# Patient Record
Sex: Male | Born: 1980 | Race: White | Hispanic: No | Marital: Married | Smoking: Never smoker
Health system: Southern US, Community
[De-identification: ages and names within clinical notes are randomized; demographics above are authoritative.]

## PROBLEM LIST (undated history)

## (undated) DIAGNOSIS — K219 Gastro-esophageal reflux disease without esophagitis: Secondary | ICD-10-CM

## (undated) DIAGNOSIS — I10 Essential (primary) hypertension: Secondary | ICD-10-CM

## (undated) DIAGNOSIS — G4733 Obstructive sleep apnea (adult) (pediatric): Secondary | ICD-10-CM

## (undated) DIAGNOSIS — E669 Obesity, unspecified: Secondary | ICD-10-CM

## (undated) DIAGNOSIS — E785 Hyperlipidemia, unspecified: Secondary | ICD-10-CM

## (undated) HISTORY — DX: Obesity, unspecified: E66.9

## (undated) HISTORY — DX: Gastro-esophageal reflux disease without esophagitis: K21.9

## (undated) HISTORY — DX: Essential (primary) hypertension: I10

## (undated) HISTORY — DX: Hyperlipidemia, unspecified: E78.5

## (undated) HISTORY — DX: Obstructive sleep apnea (adult) (pediatric): G47.33

## (undated) HISTORY — PX: HERNIA REPAIR: SHX51

---

## 2005-05-11 ENCOUNTER — Ambulatory Visit (HOSPITAL_COMMUNITY): Admission: RE | Admit: 2005-05-11 | Discharge: 2005-05-11 | Payer: Self-pay | Admitting: General Surgery

## 2005-05-11 ENCOUNTER — Ambulatory Visit (HOSPITAL_BASED_OUTPATIENT_CLINIC_OR_DEPARTMENT_OTHER): Admission: RE | Admit: 2005-05-11 | Discharge: 2005-05-11 | Payer: Self-pay | Admitting: General Surgery

## 2010-01-06 ENCOUNTER — Ambulatory Visit: Payer: Self-pay | Admitting: Internal Medicine

## 2010-01-06 ENCOUNTER — Inpatient Hospital Stay (HOSPITAL_COMMUNITY): Admission: EM | Admit: 2010-01-06 | Discharge: 2010-01-08 | Payer: Self-pay | Admitting: Emergency Medicine

## 2011-03-04 IMAGING — CT CT ABD-PELV W/ CM
2 of 4 series · 14 of 32 positions shown, 19 images · IV contrast (water & 100 ML OMNI 300)
Comparison: None available.

CLINICAL DATA: Nausea and vomiting.  Diarrhea.

CT ABDOMEN AND PELVIS WITH CONTRAST
TECHNIQUE: Multidetector CT imaging of the abdomen and pelvis was
performed following the standard protocol during bolus
administration of intravenous contrast.
Contrast: 100 ml Omnipaque 300

[Series 2: routine abdomen · axial · 0.70mm/px · z∈[-391,-71]mm · 6 of 85 slices shown, 11 images]
[im 13/85  soft-tissue]
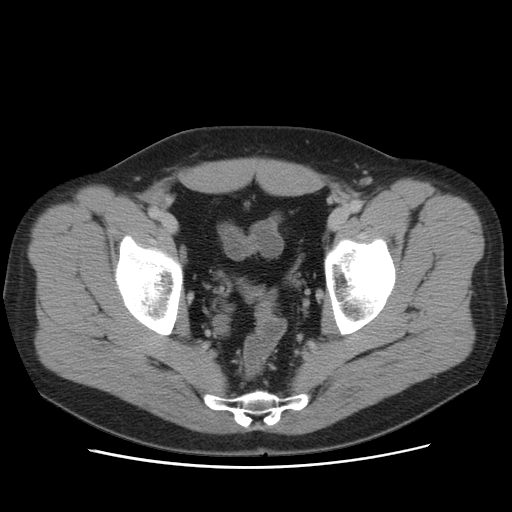
[im 13/85  bone]
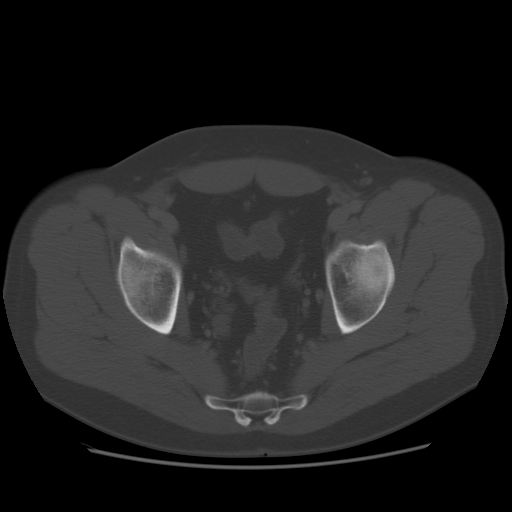
[im 25/85  soft-tissue]
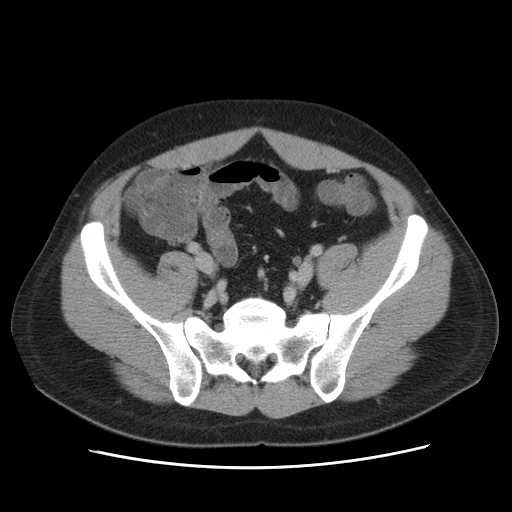
[im 37/85  soft-tissue]
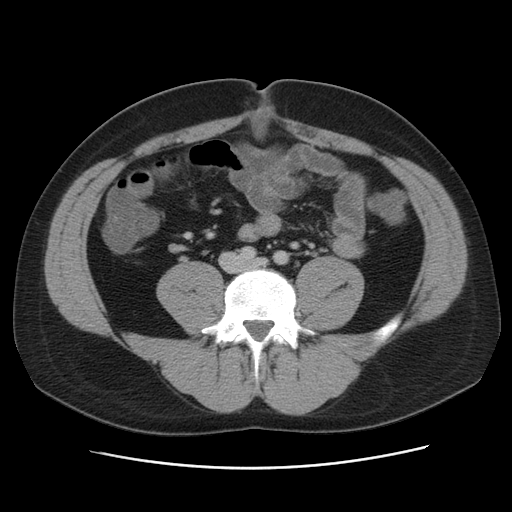
[im 37/85  lung]
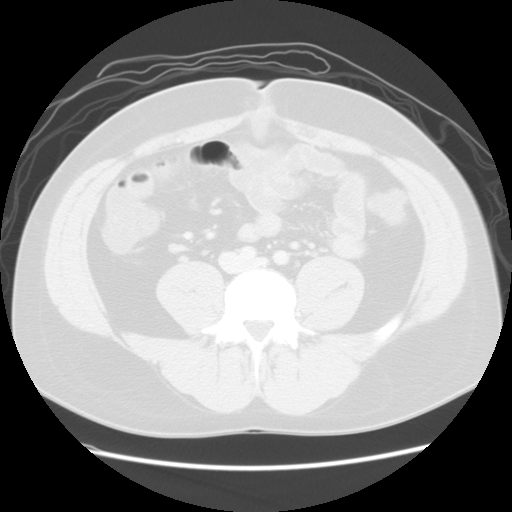
[im 49/85  soft-tissue]
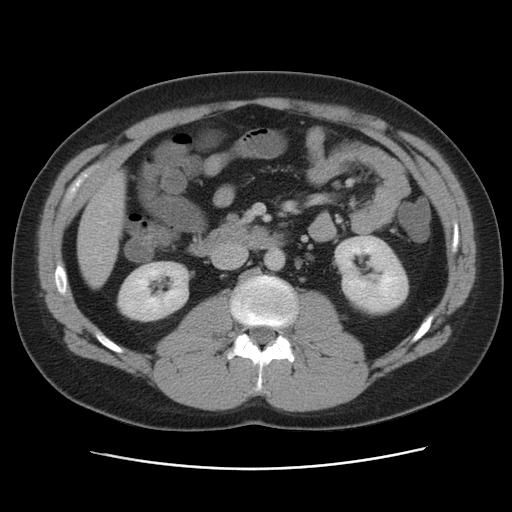
[im 49/85  lung]
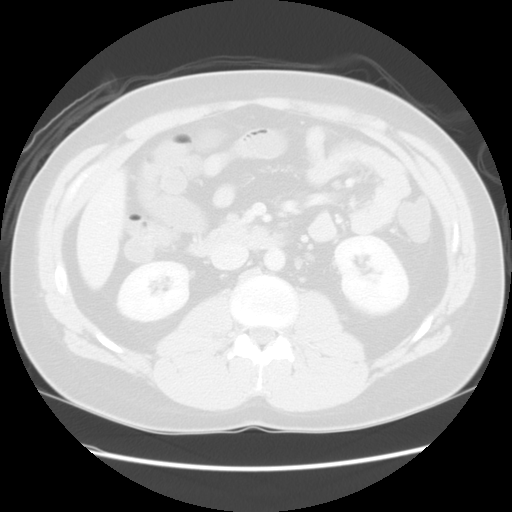
[im 61/85  soft-tissue]
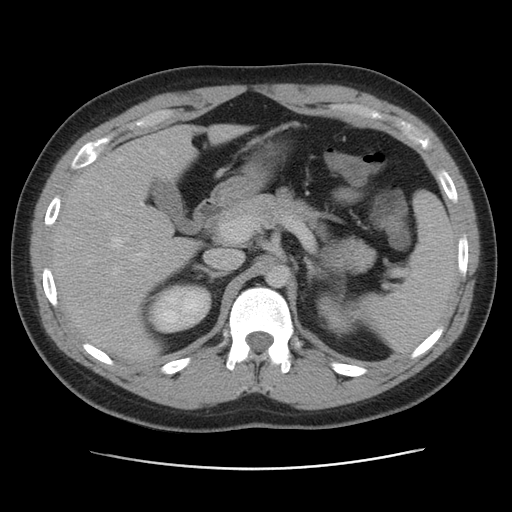
[im 61/85  lung]
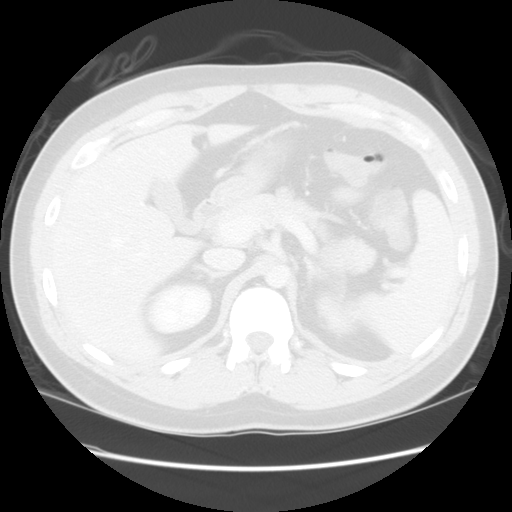
[im 73/85  soft-tissue]
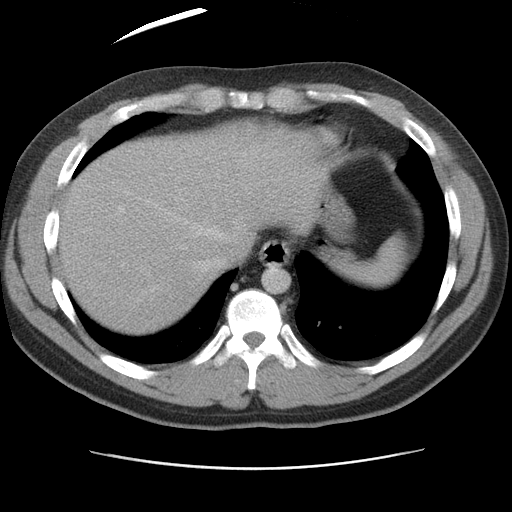
[im 73/85  lung]
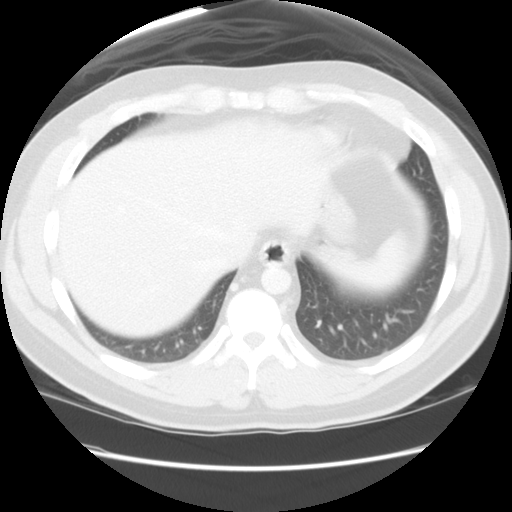

[Series 401: reformatted · sagittal · 0.82mm/px · 8 of 141 slices shown]
[im 13/141  soft-tissue]
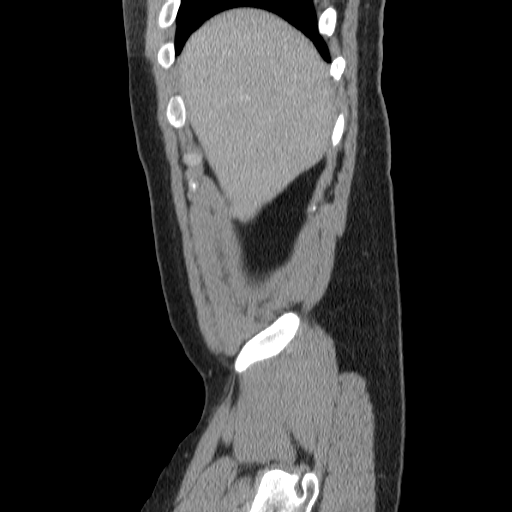
[im 26/141  soft-tissue]
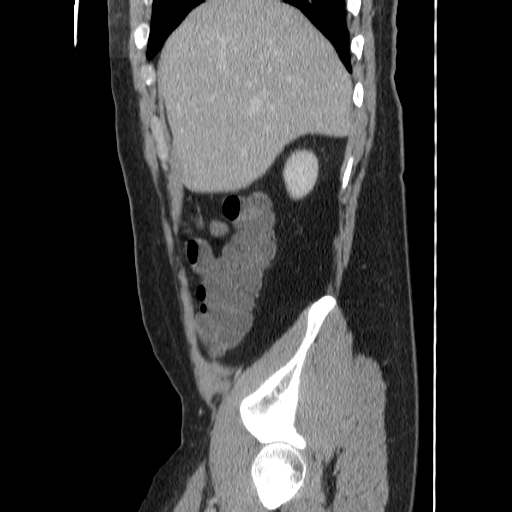
[im 51/141  soft-tissue]
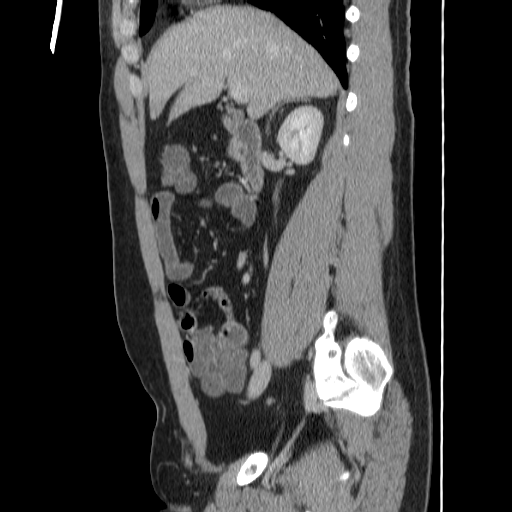
[im 64/141  soft-tissue]
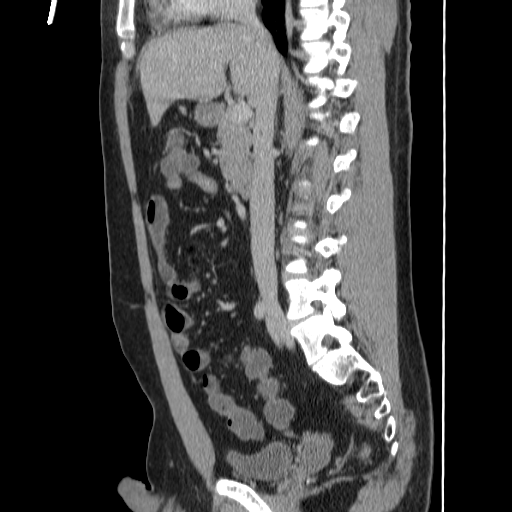
[im 77/141  soft-tissue]
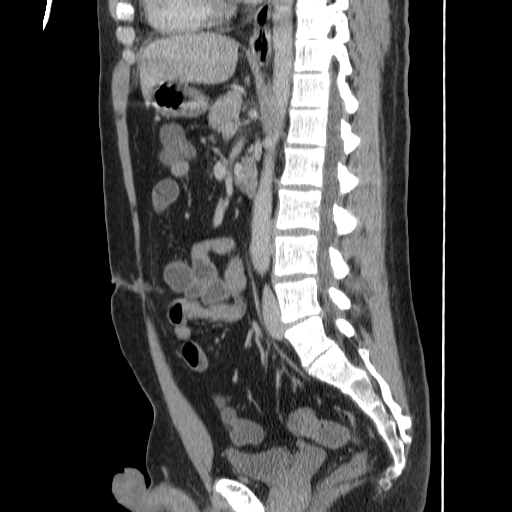
[im 90/141  soft-tissue]
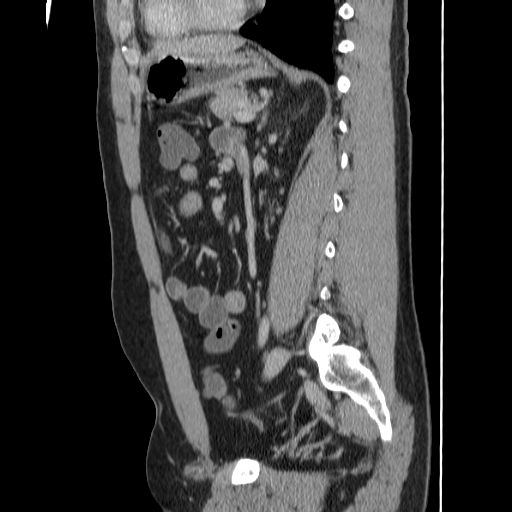
[im 115/141  soft-tissue]
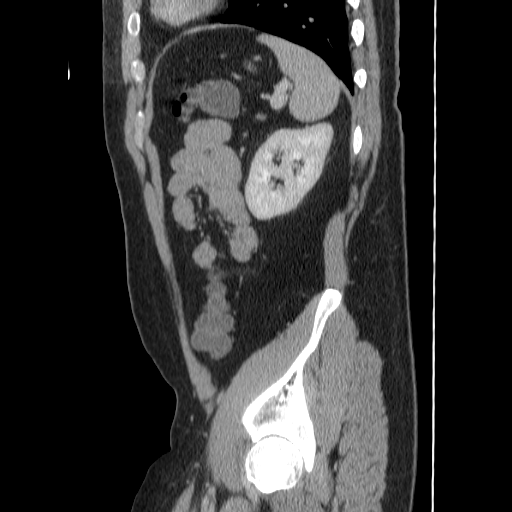
[im 128/141  soft-tissue]
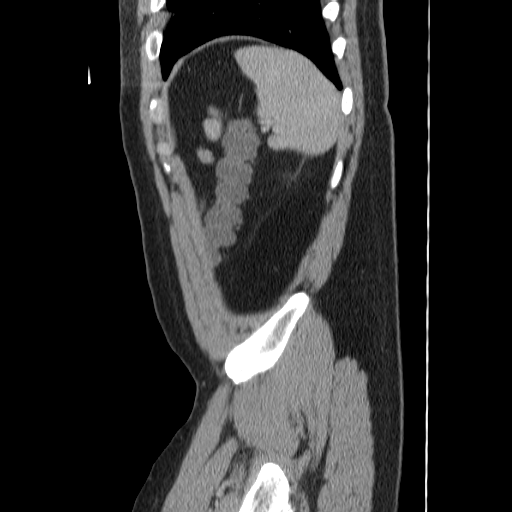

[14 of 32 positions shown; findings below may reference images not displayed]

FINDINGS: Minimal atelectasis is present at the lung bases
bilaterally.  The heart size is normal.  No significant pleural or
pericardial effusion is present.

The liver and spleen are normal.  The stomach, duodenum, and
pancreas are within normal limits.  The common bile duct and
gallbladder are normal.  The adrenal glands and kidneys are normal
bilaterally.

Diverticular changes are present within the sigmoid colon.  Fluid
can be seen throughout the colon.  The wall may be slightly
hyperemic.  No mass lesions are seen.  There is relative dilation
of the mid small bowel.  The transition point is seen distally
along the anterior wall of the abdomen.  This may represent an
adhesion.  There is some wall thickening associated with the
transition point.  No discrete mass lesion is evident.

The urinary bladder and prostate gland are normal.  No significant
free fluid or adenopathy is evident.

The bone windows are unremarkable.  Note is made of partial
sacralization of the L5 segment.
IMPRESSION: 1.  Mild dilation of mid small bowel with a partial obstruction.
2.  Minimal fluid adjacent to a para umbilical hernia repair.  No
residual hernia is identified.
3.  Distal sigmoid diverticulosis without diverticulitis.
4.  Partial sacralization of the L5 segment.

## 2011-03-14 LAB — CBC
HCT: 37.6 % — ABNORMAL LOW (ref 39.0–52.0)
Hemoglobin: 14.4 g/dL (ref 13.0–17.0)
Hemoglobin: 17.1 g/dL — ABNORMAL HIGH (ref 13.0–17.0)
MCHC: 34.5 g/dL (ref 30.0–36.0)
MCHC: 34.5 g/dL (ref 30.0–36.0)
MCHC: 34.7 g/dL (ref 30.0–36.0)
MCV: 85.1 fL (ref 78.0–100.0)
MCV: 85.2 fL (ref 78.0–100.0)
Platelets: 170 10*3/uL (ref 150–400)
Platelets: 231 10*3/uL (ref 150–400)
Platelets: 263 10*3/uL (ref 150–400)
RBC: 4.89 MIL/uL (ref 4.22–5.81)
RBC: 5.8 MIL/uL (ref 4.22–5.81)
RDW: 12.7 % (ref 11.5–15.5)
RDW: 12.8 % (ref 11.5–15.5)
WBC: 26.1 10*3/uL — ABNORMAL HIGH (ref 4.0–10.5)

## 2011-03-14 LAB — URINALYSIS, ROUTINE W REFLEX MICROSCOPIC
Bilirubin Urine: NEGATIVE
Glucose, UA: NEGATIVE mg/dL
Hgb urine dipstick: NEGATIVE
Nitrite: NEGATIVE
Protein, ur: NEGATIVE mg/dL
Specific Gravity, Urine: 1.028 (ref 1.005–1.030)
Urobilinogen, UA: 0.2 mg/dL (ref 0.0–1.0)

## 2011-03-14 LAB — COMPREHENSIVE METABOLIC PANEL
ALT: 19 U/L (ref 0–53)
Albumin: 5.1 g/dL (ref 3.5–5.2)
Alkaline Phosphatase: 65 U/L (ref 39–117)
Chloride: 101 mEq/L (ref 96–112)
Creatinine, Ser: 1.11 mg/dL (ref 0.4–1.5)
GFR calc non Af Amer: >60 mL/min (ref >60)
Sodium: 137 mEq/L (ref 135–145)
Total Bilirubin: 0.7 mg/dL (ref 0.3–1.2)
Total Protein: 8.5 g/dL — ABNORMAL HIGH (ref 6.0–8.3)

## 2011-03-14 LAB — BASIC METABOLIC PANEL
BUN: 3 mg/dL — ABNORMAL LOW (ref 6–23)
BUN: 7 mg/dL (ref 6–23)
CO2: 26 mEq/L (ref 19–32)
CO2: 29 mEq/L (ref 19–32)
Calcium: 8.7 mg/dL (ref 8.4–10.5)
Chloride: 102 mEq/L (ref 96–112)
Chloride: 103 mEq/L (ref 96–112)
Chloride: 107 mEq/L (ref 96–112)
Creatinine, Ser: 0.95 mg/dL (ref 0.4–1.5)
Creatinine, Ser: 1.04 mg/dL (ref 0.4–1.5)
Creatinine, Ser: 1.04 mg/dL (ref 0.4–1.5)
Creatinine, Ser: 1.06 mg/dL (ref 0.4–1.5)
GFR calc Af Amer: >60 mL/min (ref >60)
GFR calc Af Amer: >60 mL/min (ref >60)
GFR calc non Af Amer: >60 mL/min (ref >60)
GFR calc non Af Amer: >60 mL/min (ref >60)
Glucose, Bld: 106 mg/dL — ABNORMAL HIGH (ref 70–99)
Glucose, Bld: 92 mg/dL (ref 70–99)
Glucose, Bld: 97 mg/dL (ref 70–99)
Glucose, Bld: 98 mg/dL (ref 70–99)
Potassium: 3.4 mEq/L — ABNORMAL LOW (ref 3.5–5.1)
Potassium: 3.5 mEq/L (ref 3.5–5.1)
Potassium: 4.3 mEq/L (ref 3.5–5.1)
Sodium: 136 mEq/L (ref 135–145)
Sodium: 138 mEq/L (ref 135–145)

## 2011-03-14 LAB — STOOL CULTURE

## 2011-03-14 LAB — CULTURE, BLOOD (ROUTINE X 2)
Culture: NO GROWTH
Culture: NO GROWTH

## 2011-03-14 LAB — DIFFERENTIAL
Basophils Absolute: 0 10*3/uL (ref 0.0–0.1)
Basophils Relative: 0 % (ref 0–1)
Monocytes Relative: 3 % (ref 3–12)

## 2011-03-14 LAB — LACTIC ACID, PLASMA: Lactic Acid, Venous: 1.3 mmol/L (ref 0.5–2.2)

## 2011-03-14 LAB — LIPASE, BLOOD: Lipase: 18 U/L (ref 11–59)

## 2011-03-14 LAB — PROTIME-INR
INR: 1.12 (ref 0.00–1.49)
Prothrombin Time: 14.3 seconds (ref 11.6–15.2)

## 2011-03-14 LAB — FECAL LACTOFERRIN, QUANT

## 2011-03-14 LAB — TSH: TSH: 0.488 u[IU]/mL (ref 0.350–4.500)

## 2011-03-14 LAB — GIARDIA/CRYPTOSPORIDIUM SCREEN(EIA)

## 2011-03-14 LAB — CLOSTRIDIUM DIFFICILE EIA

## 2011-05-14 NOTE — Op Note (Signed)
NAMEHARLO, FABELA             ACCOUNT NO.:  [id number]   MEDICAL RECORD NO.:  [id number]          PATIENT TYPE:  AMB   LOCATION:  DSC                          FACILITY:  [facility]   PHYSICIAN:  Leonie Man, M.D.   DATE OF BIRTH:  [birth year]   DATE OF PROCEDURE:  05/11/2005  DATE OF DISCHARGE:                                 OPERATIVE REPORT   PREOPERATIVE DIAGNOSIS:  Incarcerated umbilical hernia.   POSTOPERATIVE DIAGNOSIS:  Incarcerated umbilical hernia.   PROCEDURE:  Repair of incarcerated umbilical hernia with mesh.   SURGEON:  Leonie Man, M.D.   ASSISTANT:  Operating room technician.   ANESTHESIA:  General.   SPECIMENS TO PATHOLOGY:  None.   ESTIMATED BLOOD LOSS:  Is minimal.   COMPLICATIONS:  None.   CONDITION TO THE PACU:  Excellent.   INDICATIONS FOR PROCEDURE:  Mr. Lepera is a 30 year old gentleman  presenting, who is a Consulting civil engineer at a Teacher, early years/pre.  He had been having  increasing pain for the past two months in the region of his umbilicus.  He  had not been having any nausea or vomiting, and he has not had any  intractable pain.  The pain usually goes away when he lays flat.  On  examination he was noted to have a small incarcerated umbilical hernia, and  he comes to the operating room now for a repair, after the risks and  potential benefits of the surgery had been discussed and all questions  answered, and a consent obtained.   DESCRIPTION OF PROCEDURE:  Following the induction of satisfactory general  anesthesia, with the patient positioned supinely, the abdomen was routinely  prepped and draped, to be included in the sterile operative field.  A  supraumbilical incision was carried down through the skin and subcutaneous  tissues, raising the umbilical skin as a flap and carrying it inferiorly.  The umbilical hernia sac is dissected free from the overlying skin, and  dissection carried down to the fascia of the anterior abdominal wall.  The  hernia is released from its attachment to the fascia and the incarcerated  omentum and round ligament are then clamped and amputated and ligated.  The  removed hernia sac and contents are then discarded.  The tissue attached to  the hernia is then dissected free with electrocautery.  The defect was then  bridged with a large mesh plug, medium-sized, which was pushed into the  defect and then sutured into the fascia with multiple sutures of #0 Novofil.  At the end of this, the sponge, instrument and sharp counts were verified.  The hernia repair was noted to be intact.  The subcutaneous tissues were  then closed with interrupted #2-0 Vicryl sutures.  The skin was closed with  running #4-0 Monocryl sutures and reinforced with Steri-Strips.  A sterile  compressive dressing was applied.  The anesthetic was reversed.   The patient was removed from the operating room to the recovery room in  stable condition.  He tolerated the procedure well.      PB/MEDQ  D:  05/11/2005  T:  05/11/2005  Job:  [id number] 

## 2016-04-20 ENCOUNTER — Ambulatory Visit (INDEPENDENT_AMBULATORY_CARE_PROVIDER_SITE_OTHER): Payer: Commercial Managed Care - HMO | Admitting: Cardiovascular Disease

## 2016-04-20 ENCOUNTER — Encounter: Payer: Self-pay | Admitting: Cardiovascular Disease

## 2016-04-20 VITALS — BP 174/96 | HR 80 | Ht 67.0 in | Wt 205.2 lb

## 2016-04-20 DIAGNOSIS — E785 Hyperlipidemia, unspecified: Secondary | ICD-10-CM | POA: Diagnosis not present

## 2016-04-20 DIAGNOSIS — K219 Gastro-esophageal reflux disease without esophagitis: Secondary | ICD-10-CM

## 2016-04-20 DIAGNOSIS — I10 Essential (primary) hypertension: Secondary | ICD-10-CM

## 2016-04-20 DIAGNOSIS — R079 Chest pain, unspecified: Secondary | ICD-10-CM | POA: Diagnosis not present

## 2016-04-20 DIAGNOSIS — E669 Obesity, unspecified: Secondary | ICD-10-CM

## 2016-04-20 MED ORDER — LOSARTAN POTASSIUM 50 MG PO TABS: 50.0000 mg | ORAL_TABLET | Freq: Every day | ORAL | Status: DC

## 2016-04-20 NOTE — Patient Instructions (Addendum)
Medication Instructions:  START LOSARTAN 50 MG DAILY   Labwork: FASTING LP/CMET IN 1 WEEK AT [facility] ON THE FIRST FLOOR  Testing/Procedures: Your physician has requested that you have an exercise tolerance test. For further information please visit [email or web address]. Please also follow instruction sheet, as given.  Follow-Up: Your physician recommends that you schedule a follow-up appointment in: 1 MONTH OV  A REFERRAL HAS BEEN MADE TO DR PYRTLE FOR YOUR REFLUX. IF YOU DO NOT HEAR FROM [facility] PLEASE CALL BACK AS [phone]  If you need a refill on your cardiac medications before your next appointment, please call your pharmacy.

## 2016-04-20 NOTE — Progress Notes (Signed)
Cardiology Office Note   Date:  04/20/2016   ID:  Craig AquasJoseph Powell, DOB 26-Jul-1981, MRN [id number]  PCP:  Arlyss QueenONROY,NATHAN, PA-C  Cardiologist:   Chilton Siiffany [place], MD   Chief Complaint  Patient presents with  . New Evaluation    pt c/o discomfort on left rib cage, elevated BP, having heart flutters off and on for 6 months, random dizziness      History of Present Illness: Craig Powell is a 35 y.o. male who presents for Management of hypertension and hyperlipidemia. He reports borderline hypertension since she was in college. He has never been on any medications. He's been reluctant to start anything given that he is young. His primary care physician recommended for both hypertension and high cholesterol.  He checks his blood pressure regularly and it typically ranges 135-160/80-105.  Reports that his last total cholesterol was in the high 200s.  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  He is typically on his feet all day and has developed plantar fasciitis. This makes it difficult for him to exercise at night. He also endorses fatigue and doesn't feel like exercising by the time he gets home. He reports gaining 10-15 pounds in the last several years. He notes that his blood pressure was better controlled when his weight was 100 pounds. He notices episodes of dizziness that typically occur when he is exerting himself physically or when he is upset. He also notices left-sided chest discomfort that has been occurring intermittently for the last 1-1/2 years. He describes it as pressure that is worse with lifting heavy objects. There is some associated shortness of breath but he denies nausea or diaphoresis. He does have episodes of nausea almost every morning and has emesis 3-4 times per week. He reports coughing up clear phlegm. He has a history of severe gastroesophageal reflux disease  that is much better on his PPI. However, if he stops taking it for even a few days he has daily nausea and vomiting in the mornings.  He denies lower extremity edema, orthopnea or PND.  Craig Powell;' father was recently diagnosed with 3 vessel CAD requiring CABG.  His parents urged him to come in for evaluation and start treatment if necessary.   Past Medical History  Diagnosis Date  . Hypertension   . Hyperlipidemia     Past Surgical History  Procedure Laterality Date  . Hernia repair      umbilical, and right groin    Current Outpatient Prescriptions  Medication Sig Dispense Refill  . omeprazole (PRILOSEC) 40 MG capsule Take 1 capsule by mouth daily.  11  . losartan (COZAAR) 50 MG tablet Take 1 tablet (50 mg total) by mouth daily. 30 tablet 5   No current facility-administered medications for this visit.    Allergies:   Review of patient's allergies indicates no known allergies.    Social History:  The patient  reports that he has never smoked. He does not have any smokeless tobacco history on file. He reports that he does not drink alcohol or use illicit drugs.   Family History:  The patient's family history includes Cancer in his paternal grandmother; Heart attack in his maternal grandfather and paternal grandfather; Hypertension in his father, maternal grandfather, and paternal grandfather.    ROS:  Please see the history of present illness.  Otherwise, review of systems are positive for none.   All  other systems are reviewed and negative.    PHYSICAL EXAM: VS:  BP 174/96 mmHg  Pulse 80  Ht  (1.702 m)  Wt 93.078 kg (205 lb 3.2 oz)  BMI 32.13 kg/m2 , BMI Body mass index is 32.13 kg/(m^2). GENERAL:  Well appearing HEENT:  Pupils equal round and reactive, fundi not visualized, oral mucosa unremarkable NECK:  No jugular venous distention, waveform within normal limits, carotid upstroke brisk and symmetric, no bruits, no thyromegaly LYMPHATICS:  No cervical  adenopathy LUNGS:  Clear to auscultation bilaterally HEART:  RRR.  PMI not displaced or sustained,S1 and S2 within normal limits, no S3, no S4, no clicks, no rubs, no murmurs CHEST: No chest wall tenderness to palpation  ABD:  Flat, positive bowel sounds normal in frequency in pitch, no bruits, no rebound, no guarding, no midline pulsatile mass, no hepatomegaly, no splenomegaly EXT:  2 plus pulses throughout, no edema, no cyanosis no clubbing SKIN:  No rashes no nodules NEURO:  Cranial nerves II through XII grossly intact, motor grossly intact throughout PSYCH:  Cognitively intact, oriented to person place and time  EKG:  EKG is ordered today. The ekg ordered today demonstrates sinus rhythm. 80 bpm. Cannot rule out prior inferior MI.  Recent Labs: No results found for requested labs within last 365 days.   Lipid Panel No results found for: CHOL, TRIG, HDL, CHOLHDL, VLDL, LDLCALC, LDLDIRECT    Wt Readings from Last 3 Encounters:  04/20/16 93.078 kg (205 lb 3.2 oz)      ASSESSMENT AND PLAN:  # Hypertension: BP is poorly-controlled both here at at home.  We will start losartan 50 mg po daily. We will check a basic metabolic panel in 1 week.  # Hyperlipidemia: Cholesterol levels have been extremelely elevated in the past, but we will check a fasting lipid panel before starting a statin.  He has been hesitant in the past, but given his father's recent CABG, he is willing to consider it.  # Chest pain: Symptoms are atypical.  However, he has several risk factors for CAD that are untreated.  Therefore, we will obtain a treadmill stress test to evaluate for ischemia.  # Obesity: We discussed the importance of increasing his physical activity to at least 30-40 minutes most days of the week.  We also discussed limiting fried and fatty foods.  # GERD: Symptoms are uncontrolled on PPI.  Will refer to Dr. Rhea Belton for further evaluation.  Current medicines are reviewed at length with the  patient today.  The patient does not have concerns regarding medicines.  The following changes have been made:  Start losartan 50 mg po daily   Labs/ tests ordered today include:   Orders Placed This Encounter  Procedures  . Comprehensive Metabolic Panel (CMET)  . Lipid panel  . Ambulatory referral to Gastroenterology  . Exercise Tolerance Test  . EKG 12-Lead     Disposition:   FU with Moss Berry C. Duke Salvia, MD, [facility] in     This note was written with the assistance of speech recognition software.  Please excuse any transcriptional errors.  Signed, Tyjah Hai C. Duke Salvia, MD, [facility]  04/20/2016 11:50 PM    [facility]

## 2016-04-22 ENCOUNTER — Encounter: Payer: Self-pay | Admitting: Cardiovascular Disease

## 2016-04-22 DIAGNOSIS — I10 Essential (primary) hypertension: Secondary | ICD-10-CM

## 2016-04-22 DIAGNOSIS — E66811 Obesity, class 1: Secondary | ICD-10-CM

## 2016-04-22 DIAGNOSIS — E669 Obesity, unspecified: Secondary | ICD-10-CM

## 2016-04-22 DIAGNOSIS — E785 Hyperlipidemia, unspecified: Secondary | ICD-10-CM | POA: Insufficient documentation

## 2016-04-22 HISTORY — DX: Obesity, class 1: E66.811

## 2016-04-22 HISTORY — DX: Obesity, unspecified: E66.9

## 2016-04-22 HISTORY — DX: Essential (primary) hypertension: I10

## 2016-04-27 ENCOUNTER — Ambulatory Visit (INDEPENDENT_AMBULATORY_CARE_PROVIDER_SITE_OTHER): Payer: Commercial Managed Care - HMO | Admitting: Gastroenterology

## 2016-04-27 ENCOUNTER — Encounter: Payer: Self-pay | Admitting: Gastroenterology

## 2016-04-27 VITALS — BP 120/70 | HR 80 | Ht 67.0 in | Wt 203.0 lb

## 2016-04-27 DIAGNOSIS — K219 Gastro-esophageal reflux disease without esophagitis: Secondary | ICD-10-CM | POA: Diagnosis not present

## 2016-04-27 NOTE — Progress Notes (Signed)
HPI: This is a    very pleasant 35 year old man   who was referred to me by Lonie Peakonroy, Nathan, PA-C  to evaluate  GERD, left upper quadrant pain .    Chief complaint is GERD, left upper quadrant pain  GERD for years.    Vomits in AM, vomits at night.  1-2 times per week he will vomit in AM  Thi shas been a problem for 5-10 years.  Has spells of it.  Started omeprazole about a year ago, takes at bedtime 40mg .  Without this pyrosis constantly.  Post prandial vomiting.  dypshagia occasionally.  Overall stable weight.  Wakes up, within 15 minutes; coughs a lot.  Then vomits  paticularly on cold days.  Takes tylenol PRN for feet  non-smoker.  He has had intermittent left upper quadrant pain for about a year now. It does not radiate. It is not associated with nausea vomiting. It is not positional. Eating does tend to exacerbate it at times. It is a pulling twisting sensation. Lasts 20-40 minutes   Review of systems: Pertinent positive and negative review of systems were noted in the above HPI section. Complete review of systems was performed and was otherwise normal.   Past Medical History  Diagnosis Date  . Hypertension   . Hyperlipidemia   . Essential hypertension 04/22/2016  . Obesity (BMI 30.0-34.9) 04/22/2016    Past Surgical History  Procedure Laterality Date  . Hernia repair      umbilical, and right groin    Current Outpatient Prescriptions  Medication Sig Dispense Refill  . losartan (COZAAR) 50 MG tablet Take 1 tablet (50 mg total) by mouth daily. 30 tablet 5  . omeprazole (PRILOSEC) 40 MG capsule Take 1 capsule by mouth daily.  11   No current facility-administered medications for this visit.    Allergies as of 04/27/2016  . (No Known Allergies)    Family History  Problem Relation Age of Onset  . Hypertension Father   . Hypertension Maternal Grandfather   . Heart attack Maternal Grandfather   . Cancer Paternal Grandmother   . Hypertension Paternal  Grandfather   . Heart attack Paternal Grandfather     Social History   Social History  . Marital Status: Married    Spouse Name: N/A  . Number of Children: N/A  . Years of Education: N/A   Occupational History  . Not on file.   Social History Main Topics  . Smoking status: Never Smoker   . Smokeless tobacco: Never Used  . Alcohol Use: No  . Drug Use: No  . Sexual Activity: Not on file   Other Topics Concern  . Not on file   Social History Narrative     Physical Exam: BP 120/70 mmHg  Pulse 80  Ht 5\' 7"  (1.702 m)  Wt 203 lb (92.08 kg)  BMI 31.79 kg/m2 Constitutional: generally well-appearing Psychiatric: alert and oriented x3 Eyes: extraocular movements intact Mouth: oral pharynx moist, no lesions Neck: supple no lymphadenopathy Cardiovascular: heart regular rate and rhythm Lungs: clear to auscultation bilaterally Abdomen: soft, nontender, nondistended, no obvious ascites, no peritoneal signs, normal bowel sounds Extremities: no lower extremity edema bilaterally Skin: no lesions on visible extremities   Assessment and plan: 35 y.o. male with  Classic GERD symptoms, intermittent dysphasia, left upper quadrant pains  I recommended he change the way he is taking his antiacid medicines. He will begin taking his omeprazole at 20-30 minutes prior to his first meal of the day and  he will add ranitidine 150 mg at bedtime every night. I think this will be better  acid control and he currently has. Given his dysphagia upper quadrant pains and I recommended we proceed with EGD to rule out stricture, H. pylori, peptic ulcer disease. I see no reason for any further blood tests or imaging studies prior to then.   Rob Bunting, MD [facility] 04/27/2016, 8:28 AM  Cc: Lonie Peak, PA-C

## 2016-04-27 NOTE — Patient Instructions (Signed)
You should change the way you are taking your antiacid medicine (omeprazole) so that you are taking it 20-30 minutes prior to a decent meal as that is the way the pill is designed to work most effectively. Ranitidine 150mg  pills, take one at bedtime every night. You will be set up for an upper endoscopy for GERD, dysphagia.

## 2016-04-28 ENCOUNTER — Telehealth (HOSPITAL_COMMUNITY): Payer: Self-pay

## 2016-04-28 NOTE — Telephone Encounter (Signed)
UTR. I will attempt again tomorrow.

## 2016-04-29 ENCOUNTER — Telehealth (HOSPITAL_COMMUNITY): Payer: Self-pay

## 2016-04-29 LAB — COMPREHENSIVE METABOLIC PANEL
ALT: 16 U/L (ref 9–46)
AST: 13 U/L (ref 10–40)
Albumin: 4.6 g/dL (ref 3.6–5.1)
Alkaline Phosphatase: 51 U/L (ref 40–115)
BILIRUBIN TOTAL: 0.5 mg/dL (ref 0.2–1.2)
BUN: 11 mg/dL (ref 7–25)
CALCIUM: 9.7 mg/dL (ref 8.6–10.3)
CHLORIDE: 102 mmol/L (ref 98–110)
CO2: 26 mmol/L (ref 20–31)
Creat: 0.96 mg/dL (ref 0.60–1.35)
GLUCOSE: 102 mg/dL — AB (ref 65–99)
Potassium: 4.7 mmol/L (ref 3.5–5.3)
SODIUM: 137 mmol/L (ref 135–146)
Total Protein: 7 g/dL (ref 6.1–8.1)

## 2016-04-29 LAB — LIPID PANEL
Cholesterol: 202 mg/dL — ABNORMAL HIGH (ref 125–200)
HDL: 34 mg/dL — AB (ref >=40)
LDL CALC: 141 mg/dL — AB (ref <130)
Total CHOL/HDL Ratio: 5.9 Ratio — ABNORMAL HIGH (ref <=5.0)
Triglycerides: 137 mg/dL (ref <150)
VLDL: 27 mg/dL (ref <30)

## 2016-04-29 NOTE — Telephone Encounter (Signed)
UTR Encounter complete. 

## 2016-04-30 ENCOUNTER — Ambulatory Visit (HOSPITAL_COMMUNITY)
Admission: RE | Admit: 2016-04-30 | Discharge: 2016-04-30 | Disposition: A | Discharge status: Home / Self Care | Payer: Commercial Managed Care - HMO | Source: Ambulatory Visit | Attending: Cardiovascular Disease | Admitting: Cardiovascular Disease

## 2016-04-30 DIAGNOSIS — R079 Chest pain, unspecified: Secondary | ICD-10-CM | POA: Diagnosis not present

## 2016-04-30 DIAGNOSIS — I10 Essential (primary) hypertension: Secondary | ICD-10-CM

## 2016-05-02 LAB — EXERCISE TOLERANCE TEST
CHL CUP RESTING HR STRESS: 63 {beats}/min
CHL RATE OF PERCEIVED EXERTION: 17
CSEPED: 12 min
CSEPHR: 106 %
CSEPPHR: 200 {beats}/min
Estimated workload: 13.7 METS
Exercise duration (sec): 12 s
MPHR: 188 {beats}/min

## 2016-05-13 ENCOUNTER — Telehealth: Payer: Self-pay | Admitting: *Deleted

## 2016-05-13 DIAGNOSIS — E7800 Pure hypercholesterolemia, unspecified: Secondary | ICD-10-CM

## 2016-05-13 NOTE — Telephone Encounter (Signed)
-----   Message from Chilton Siiffany [place], MD sent at 05/03/2016 12:58 PM EDT ----- Normal kidney function and electrolytes.  Cholesterol levels are elevated.  Work on diet and exercise at least 30-40 minutes most days of the week.  Lets repeat lipids in 3 months before deciding if he needs to start medicine. Reschedule follow up for after the lipids.

## 2016-05-13 NOTE — Telephone Encounter (Signed)
-----   Message from Chilton Siiffany [place], MD sent at 05/02/2016  6:19 PM EDT ----- Normal stress test test.  Excellent exercise capacity.

## 2016-05-13 NOTE — Telephone Encounter (Signed)
Advised patient of lab results and stress test Rescheduled ov and mailed orders for labs prior to visit

## 2016-05-14 ENCOUNTER — Encounter: Payer: Self-pay | Admitting: Gastroenterology

## 2016-05-28 ENCOUNTER — Ambulatory Visit: Payer: Commercial Managed Care - HMO | Admitting: Gastroenterology

## 2016-05-28 ENCOUNTER — Encounter: Payer: Self-pay | Admitting: Gastroenterology

## 2016-05-28 ENCOUNTER — Ambulatory Visit: Payer: Commercial Managed Care - HMO | Admitting: Cardiovascular Disease

## 2016-05-28 VITALS — BP 106/60 | HR 59 | Temp 98.6°F | Resp 18 | Ht 67.0 in | Wt 203.0 lb

## 2016-05-28 DIAGNOSIS — K219 Gastro-esophageal reflux disease without esophagitis: Secondary | ICD-10-CM

## 2016-05-28 MED ORDER — SODIUM CHLORIDE 0.9 % IV SOLN: 500.0000 mL | INTRAVENOUS | Status: DC

## 2016-05-28 NOTE — Patient Instructions (Addendum)
YOU HAD AN ENDOSCOPIC PROCEDURE TODAY AT THE [facility]:   Refer to the procedure report that was given to you for any specific questions about what was found during the examination.  If the procedure report does not answer your questions, please call your gastroenterologist to clarify.  If you requested that your care partner not be given the details of your procedure findings, then the procedure report has been included in a sealed envelope for you to review at your convenience later.  YOU SHOULD EXPECT: Some feelings of bloating in the abdomen. Passage of more gas than usual.  Walking can help get rid of the air that was put into your GI tract during the procedure and reduce the bloating.   Please Note:  You might notice some irritation and congestion in your nose or some drainage.  This is from the oxygen used during your procedure.  There is no need for concern and it should clear up in a day or so.  SYMPTOMS TO REPORT IMMEDIATELY:    Following upper endoscopy (EGD)  Vomiting of blood or coffee ground material  New chest pain or pain under the shoulder blades  Painful or persistently difficult swallowing  New shortness of breath  Fever of 100F or higher  Black, tarry-looking stools  For urgent or emergent issues, a gastroenterologist can be reached at any hour by calling [phone].   DIET: Your first meal following the procedure should be a small meal and then it is ok to progress to your normal diet.  Drink plenty of fluids but you should avoid alcoholic beverages for 24 hours.  ACTIVITY:  You should plan to take it easy for the rest of today and you should NOT DRIVE or use heavy machinery until tomorrow (because of the sedation medicines used during the test).    FOLLOW UP: Our staff will call the number listed on your records the next business day following your procedure to check on you and address any questions or concerns that you may have regarding the  information given to you following your procedure. If we do not reach you, we will leave a message.  However, if you are feeling well and you are not experiencing any problems, there is no need to return our call.  We will assume that you have returned to your regular daily activities without incident.  If any biopsies were taken you will be contacted by phone or by letter within the next 1-3 weeks.  Please call us at [phone] if you have not heard about the biopsies in 3 weeks.    SIGNATURES/CONFIDENTIALITY: You and/or your care partner have signed paperwork which will be entered into your electronic medical record.  These signatures attest to the fact that that the information above on your After Visit Summary has been reviewed and is understood.  Full responsibility of the confidentiality of this discharge information lies with you and/or your care-partner.  Thank-you for choosing us for your healthcare needs today.  Be sure to take your stomach medicines 1/2 hour before breakfast every day.

## 2016-05-28 NOTE — Progress Notes (Signed)
Called to room to assist during endoscopic procedure.  Patient ID and intended procedure confirmed with present staff. Received instructions for my participation in the procedure from the performing physician.  

## 2016-05-28 NOTE — Op Note (Signed)
[facility] Patient Name: Craig Powell Procedure Date: 05/28/2016 9:16 AM MRN: [id number] Endoscopist: Rachael Fee , MD Age: 35 Referring MD:  Date of Birth: 01-23-1981 Gender: Male Procedure:                Upper GI endoscopy Indications:              Dysphagia, Heartburn Medicines:                Monitored Anesthesia Care Procedure:                Pre-Anesthesia Assessment:                           - Prior to the procedure, a History and Physical                            was performed, and patient medications and                            allergies were reviewed. The patient's tolerance of                            previous anesthesia was also reviewed. The risks                            and benefits of the procedure and the sedation                            options and risks were discussed with the patient.                            All questions were answered, and informed consent                            was obtained. Prior Anticoagulants: The patient has                            taken no previous anticoagulant or antiplatelet                            agents. ASA Grade Assessment: II - A patient with                            mild systemic disease. After reviewing the risks                            and benefits, the patient was deemed in                            satisfactory condition to undergo the procedure.                           After obtaining informed consent, the endoscope was  passed under direct vision. Throughout the                            procedure, the patient's blood pressure, pulse, and                            oxygen saturations were monitored continuously. The                            Model GIF-[id number]) scope was introduced                            through the mouth, and advanced to the second part                            of duodenum. The upper GI endoscopy was               accomplished without difficulty. The patient                            tolerated the procedure well. Scope In: Scope Out: Findings:                 The esophagus was normal.                           The stomach was normal.                           The examined duodenum was normal. Biopsies for                            histology were taken with a cold forceps for                            evaluation of celiac disease. Complications:            No immediate complications. Estimated blood loss:                            None. Estimated Blood Loss:     Estimated blood loss: none. Impression:               - Normal esophagus.                           - Normal stomach.                           - Normal examined duodenum, biopsied given FH of                            Celiac Sprue                           - Recommendation:           - Patient has a contact number available for  emergencies. The signs and symptoms of potential                            delayed complications were discussed with the                            patient. Return to normal activities tomorrow.                            Written discharge instructions were provided to the                            patient.                           - Resume previous diet.                           - Continue present medications (omeprazole in AM                            shortly before breakfast AND ranitidine 150mg  pill                            at bedtime nightly.                           - Await final pathology.                           - No repeat upper endoscopy. Rachael Feeaniel P Jacobs, MD 05/28/2016 9:43:35 AM This report has been signed electronically.

## 2016-05-28 NOTE — Progress Notes (Signed)
Report to PACU, RN, vss, BBS= Clear.  

## 2016-05-31 ENCOUNTER — Telehealth: Payer: Self-pay

## 2016-05-31 NOTE — Telephone Encounter (Signed)
  Follow up Call-  Call back number 05/28/2016  Post procedure Call Back phone  # [phone]  Permission to leave phone message Yes     Patient questions:  Do you have a fever, pain , or abdominal swelling? No. Pain Score  0 *  Have you tolerated food without any problems? Yes.    Have you been able to return to your normal activities? Yes.    Do you have any questions about your discharge instructions: Diet   No. Medications  No. Follow up visit  No.  Do you have questions or concerns about your Care? No.  Actions: * If pain score is 4 or above: No action needed, pain <4.

## 2016-06-04 ENCOUNTER — Encounter: Payer: Self-pay | Admitting: Gastroenterology

## 2016-06-08 ENCOUNTER — Ambulatory Visit: Payer: Commercial Managed Care - HMO | Admitting: Cardiovascular Disease

## 2016-08-11 ENCOUNTER — Encounter: Payer: Self-pay | Admitting: Cardiovascular Disease

## 2016-08-17 ENCOUNTER — Encounter: Payer: Self-pay | Admitting: Cardiovascular Disease

## 2016-08-17 ENCOUNTER — Ambulatory Visit (INDEPENDENT_AMBULATORY_CARE_PROVIDER_SITE_OTHER): Payer: Commercial Managed Care - HMO | Admitting: Cardiovascular Disease

## 2016-08-17 VITALS — BP 144/89 | HR 69 | Ht 67.0 in | Wt 202.2 lb

## 2016-08-17 DIAGNOSIS — I10 Essential (primary) hypertension: Secondary | ICD-10-CM

## 2016-08-17 DIAGNOSIS — E785 Hyperlipidemia, unspecified: Secondary | ICD-10-CM | POA: Diagnosis not present

## 2016-08-17 MED ORDER — LOSARTAN POTASSIUM 100 MG PO TABS: 100.0000 mg | ORAL_TABLET | Freq: Every day | ORAL | 3 refills | Status: DC

## 2016-08-17 MED ORDER — PRAVASTATIN SODIUM 40 MG PO TABS: 40.0000 mg | ORAL_TABLET | Freq: Every evening | ORAL | 3 refills | Status: DC

## 2016-08-17 NOTE — Progress Notes (Signed)
Cardiology Office Note   Date:  08/17/2016   ID:  Craig AquasJoseph Detweiler, DOB March 01, 1981, MRN [id number]  PCP:  Lonie PeakNathan Conroy, PA-C  Cardiologist:   Chilton Siiffany [place], MD   Chief Complaint  Patient presents with  . Follow-up    1 Month; Pt states no Sx.       History of Present Illness: Craig Powell is a 35 y.o. male who presents for management of hypertension and hyperlipidemia. He reports borderline hypertension since he was in college. He started on losartan 04/20/16.  He reports that his blood pressure was initially well-controlled.  Lately it has been in the 130s-140s.  The only time he had low blood pressure was when he went for an EGD and he had been fasting at the time.  At the last appointment he reported atypical chest was and was referred for ETT on 04/30/16 that was negative for ischemia.  Since his last appointment he notes improvement in his chest pain and dizziness.  He continues to have episodes of nausea and emesis, though less frequent than in the past.  He saw Dr. Christella HartiganJacobs and underwent EGD 05/2016 that was unremarkable.  [name] has been trying to exercise more lately by swimming and running.  He denies chest pain or shortness of breath with these activities.  He is limited by his plantar fasciitis.  He had his cholesterol checked with his PCP recently and his total cholesterol and LDL increased from when last checked 04/2016.   Past Medical History:  Diagnosis Date  . Essential hypertension 04/22/2016  . Hyperlipidemia   . Hypertension   . Obesity (BMI 30.0-34.9) 04/22/2016    Past Surgical History:  Procedure Laterality Date  . HERNIA REPAIR     umbilical, and right groin    Current Outpatient Prescriptions  Medication Sig Dispense Refill  . losartan (COZAAR) 100 MG tablet Take 1 tablet (100 mg total) by mouth daily. 90 tablet 3  . omeprazole (PRILOSEC) 40 MG capsule Take 1 capsule by mouth daily.  11  . pravastatin (PRAVACHOL) 40 MG tablet Take 1 tablet (40  mg total) by mouth every evening. 90 tablet 3   No current facility-administered medications for this visit.     Allergies:   Review of patient's allergies indicates no known allergies.    Social History:  The patient  reports that he has never smoked. He has never used smokeless tobacco. He reports that he does not drink alcohol or use drugs.   Family History:  The patient's family history includes Cancer in his paternal grandmother; Heart attack in his maternal grandfather and paternal grandfather; Hypertension in his father, maternal grandfather, and paternal grandfather.    ROS:  Please see the history of present illness.  Otherwise, review of systems are positive for none.   All other systems are reviewed and negative.    PHYSICAL EXAM: VS:  BP (!) 144/89   Pulse 69   Ht 5\' 7"  (1.702 m)   Wt 202 lb 3.2 oz (91.7 kg)   BMI 31.67 kg/m  , BMI Body mass index is 31.67 kg/m. GENERAL:  Well appearing HEENT:  Pupils equal round and reactive, fundi not visualized, oral mucosa unremarkable NECK:  No jugular venous distention, waveform within normal limits, carotid upstroke brisk and symmetric, no bruits, no thyromegaly LYMPHATICS:  No cervical adenopathy LUNGS:  Clear to auscultation bilaterally HEART:  RRR.  PMI not displaced or sustained,S1 and S2 within normal limits, no S3, no S4, no clicks,  no rubs, no murmurs CHEST: No chest wall tenderness to palpation  ABD:  Flat, positive bowel sounds normal in frequency in pitch, no bruits, no rebound, no guarding, no midline pulsatile mass, no hepatomegaly, no splenomegaly EXT:  2 plus pulses throughout, no edema, no cyanosis no clubbing SKIN:  No rashes no nodules NEURO:  Cranial nerves II through XII grossly intact, motor grossly intact throughout PSYCH:  Cognitively intact, oriented to person place and time  EKG:  EKG is ordered today. The ekg ordered today demonstrates sinus rhythm. 80 bpm. Cannot rule out prior inferior MI.  ETT  04/30/16:   Blood pressure demonstrated a hypertensive response to exercise.  There was no ST segment deviation noted during stress.  Normal exercise tolerance test at execellent work of 13.7 mets and adequate heart rate response.  Lowr risk study.   Recent Labs: 04/28/2016: ALT 16; BUN 11; Creat 0.96; Potassium 4.7; Sodium 137   Lipid Panel    Component Value Date/Time   CHOL 202 (H) 04/28/2016 0903   TRIG 137 04/28/2016 0903   HDL 34 (L) 04/28/2016 0903   CHOLHDL 5.9 (H) 04/28/2016 0903   VLDL 27 04/28/2016 0903   LDLCALC 141 (H) 04/28/2016 0903    07/27/16: Total cholesterol 242, triglycerides 161, HDL 39, LDL 171. Soaking this seemed  Wt Readings from Last 3 Encounters:  08/17/16 202 lb 3.2 oz (91.7 kg)  05/28/16 203 lb (92.1 kg)  04/27/16 203 lb (92.1 kg)      ASSESSMENT AND PLAN:  # Hypertension: BP is much better-controlled today but slightly above goal.  It has been high at other appointments as well.  Therefore we will increase losartan to 100 mg daily.  He will get a CMP checked when he repeats his lipids in 6 weeks.   # Hyperlipidemia: Cholesterol levels are higher now than in April. He is now willing to start a statin.  He was told that he is pre-diabetic and wants to reduce the risk of developing diabetes.  Therefore we will start pravastatin 40 mg daily.  Repeat lipids and CMP in 6 weeks.  # Chest pain: Resolved.  ETT was negative for ischemia.   Current medicines are reviewed at length with the patient today.  The patient does not have concerns regarding medicines.  The following changes have been made:  Increase losartan to 100 mg daily   Labs/ tests ordered today include:   Orders Placed This Encounter  Procedures  . Lipid panel  . Comprehensive metabolic panel     Disposition:   FU with Davey Bergsma C. Duke Salviaandolph, MD, [facility] in 1 year.    This note was written with the assistance of speech recognition software.  Please excuse any transcriptional  errors.  Signed, Lyberti Thrush C. Duke Salviaandolph, MD, [facility]  08/17/2016 4:31 PM    [facility]

## 2016-08-17 NOTE — Patient Instructions (Addendum)
Medication Instructions:  START PRAVASTATIN 40 MG DAILY  INCREASE LOSARTAN TO 100 MG DAILY   Labwork: FASTING LP/CMET IN 6 WEEKS AT [facility] ON FIRST FLOOR  Testing/Procedures: NONE   Follow-Up: Your physician wants you to follow-up in: 1 YEAR OF You will receive a reminder letter in the mail two months in advance. If you don't receive a letter, please call our office to schedule the follow-up appointment.  If you need a refill on your cardiac medications before your next appointment, please call your pharmacy.

## 2016-10-07 LAB — LIPID PANEL
CHOL/HDL RATIO: 4.5 ratio (ref <=5.0)
CHOLESTEROL: 158 mg/dL (ref 125–200)
HDL: 35 mg/dL — ABNORMAL LOW (ref >=40)
LDL CALC: 97 mg/dL (ref <130)
TRIGLYCERIDES: 129 mg/dL (ref <150)
VLDL: 26 mg/dL (ref <30)

## 2016-10-07 LAB — COMPREHENSIVE METABOLIC PANEL
ALT: 15 U/L (ref 9–46)
AST: 14 U/L (ref 10–40)
Albumin: 4.6 g/dL (ref 3.6–5.1)
Alkaline Phosphatase: 51 U/L (ref 40–115)
BUN: 15 mg/dL (ref 7–25)
CALCIUM: 9.6 mg/dL (ref 8.6–10.3)
CO2: 26 mmol/L (ref 20–31)
Chloride: 103 mmol/L (ref 98–110)
Creat: 0.98 mg/dL (ref 0.60–1.35)
GLUCOSE: 97 mg/dL (ref 65–99)
POTASSIUM: 4.7 mmol/L (ref 3.5–5.3)
Sodium: 139 mmol/L (ref 135–146)
Total Bilirubin: 0.5 mg/dL (ref 0.2–1.2)
Total Protein: 7.4 g/dL (ref 6.1–8.1)

## 2016-12-24 ENCOUNTER — Inpatient Hospital Stay
Admission: EM | Admit: 2016-12-24 | Discharge: 2016-12-27 | DRG: 558 | Disposition: A | Discharge status: Home / Self Care | Payer: Commercial Managed Care - HMO | Attending: Specialist | Admitting: Specialist

## 2016-12-24 ENCOUNTER — Encounter: Payer: Self-pay | Admitting: Emergency Medicine

## 2016-12-24 DIAGNOSIS — Z6832 Body mass index (BMI) 32.0-32.9, adult: Secondary | ICD-10-CM | POA: Diagnosis not present

## 2016-12-24 DIAGNOSIS — E669 Obesity, unspecified: Secondary | ICD-10-CM | POA: Diagnosis present

## 2016-12-24 DIAGNOSIS — E785 Hyperlipidemia, unspecified: Secondary | ICD-10-CM | POA: Diagnosis not present

## 2016-12-24 DIAGNOSIS — Z8249 Family history of ischemic heart disease and other diseases of the circulatory system: Secondary | ICD-10-CM

## 2016-12-24 DIAGNOSIS — R531 Weakness: Secondary | ICD-10-CM | POA: Diagnosis present

## 2016-12-24 DIAGNOSIS — N179 Acute kidney failure, unspecified: Secondary | ICD-10-CM | POA: Diagnosis present

## 2016-12-24 DIAGNOSIS — I10 Essential (primary) hypertension: Secondary | ICD-10-CM | POA: Diagnosis present

## 2016-12-24 DIAGNOSIS — M6282 Rhabdomyolysis: Secondary | ICD-10-CM | POA: Diagnosis not present

## 2016-12-24 DIAGNOSIS — N289 Disorder of kidney and ureter, unspecified: Secondary | ICD-10-CM

## 2016-12-24 LAB — CBC WITH DIFFERENTIAL/PLATELET
Basophils Absolute: 0.1 10*3/uL (ref 0–0.1)
Basophils Relative: 1 %
Eosinophils Absolute: 0.3 10*3/uL (ref 0–0.7)
Eosinophils Relative: 3 %
HEMATOCRIT: 45.3 % (ref 40.0–52.0)
HEMOGLOBIN: 15.5 g/dL (ref 13.0–18.0)
LYMPHS PCT: 17 %
Lymphs Abs: 2.1 10*3/uL (ref 1.0–3.6)
MCH: 28.3 pg (ref 26.0–34.0)
MCHC: 34.2 g/dL (ref 32.0–36.0)
MCV: 82.7 fL (ref 80.0–100.0)
MONO ABS: 0.8 10*3/uL (ref 0.2–1.0)
MONOS PCT: 7 %
NEUTROS ABS: 9.2 10*3/uL — AB (ref 1.4–6.5)
Neutrophils Relative %: 74 %
Platelets: 342 10*3/uL (ref 150–440)
RBC: 5.48 MIL/uL (ref 4.40–5.90)
RDW: 13.2 % (ref 11.5–14.5)
WBC: 12.6 10*3/uL — ABNORMAL HIGH (ref 3.8–10.6)

## 2016-12-24 LAB — COMPREHENSIVE METABOLIC PANEL
ALK PHOS: 53 U/L (ref 38–126)
ALT: 157 U/L — ABNORMAL HIGH (ref 17–63)
ANION GAP: 11 (ref 5–15)
AST: 828 U/L — AB (ref 15–41)
Albumin: 4.7 g/dL (ref 3.5–5.0)
BILIRUBIN TOTAL: 0.6 mg/dL (ref 0.3–1.2)
BUN: 29 mg/dL — AB (ref 6–20)
CALCIUM: 9.2 mg/dL (ref 8.9–10.3)
CO2: 23 mmol/L (ref 22–32)
Chloride: 102 mmol/L (ref 101–111)
Creatinine, Ser: 2 mg/dL — ABNORMAL HIGH (ref 0.61–1.24)
GFR calc Af Amer: 49 mL/min — ABNORMAL LOW (ref >60)
GFR, EST NON AFRICAN AMERICAN: 42 mL/min — AB (ref >60)
Glucose, Bld: 118 mg/dL — ABNORMAL HIGH (ref 65–99)
POTASSIUM: 3.8 mmol/L (ref 3.5–5.1)
Sodium: 136 mmol/L (ref 135–145)
TOTAL PROTEIN: 8.3 g/dL — AB (ref 6.5–8.1)

## 2016-12-24 LAB — URINALYSIS, COMPLETE (UACMP) WITH MICROSCOPIC
BACTERIA UA: NONE SEEN
BILIRUBIN URINE: NEGATIVE
Glucose, UA: NEGATIVE mg/dL
Ketones, ur: NEGATIVE mg/dL
LEUKOCYTES UA: NEGATIVE
Nitrite: NEGATIVE
PH: 5 (ref 5.0–8.0)
Protein, ur: 100 mg/dL — AB
SPECIFIC GRAVITY, URINE: 1.014 (ref 1.005–1.030)

## 2016-12-24 LAB — CK

## 2016-12-24 MED ORDER — SODIUM CHLORIDE 0.9 % IV SOLN
INTRAVENOUS | Status: DC
  Administered 2016-12-25 – 2016-12-26 (×7): via INTRAVENOUS

## 2016-12-24 MED ORDER — ACETAMINOPHEN 325 MG PO TABS: 650.0000 mg | ORAL_TABLET | Freq: Four times a day (QID) | ORAL | Status: DC | PRN

## 2016-12-24 MED ORDER — HYDROCODONE-ACETAMINOPHEN 5-325 MG PO TABS: 1.0000 | ORAL_TABLET | ORAL | Status: DC | PRN

## 2016-12-24 MED ORDER — ONDANSETRON HCL 4 MG/2ML IJ SOLN: 4.0000 mg | Freq: Four times a day (QID) | INTRAMUSCULAR | Status: DC | PRN

## 2016-12-24 MED ORDER — SODIUM CHLORIDE 0.9 % IV SOLN
Freq: Once | INTRAVENOUS | Status: AC
  Administered 2016-12-24: 17:00:00 via INTRAVENOUS

## 2016-12-24 MED ORDER — PRAVASTATIN SODIUM 40 MG PO TABS
40.0000 mg | ORAL_TABLET | Freq: Every day | ORAL | Status: DC
  Administered 2016-12-25: 40 mg via ORAL
  Filled 2016-12-24: qty 1

## 2016-12-24 MED ORDER — ENOXAPARIN SODIUM 40 MG/0.4ML ~~LOC~~ SOLN
40.0000 mg | SUBCUTANEOUS | Status: DC
  Administered 2016-12-25 – 2016-12-26 (×2): 40 mg via SUBCUTANEOUS
  Filled 2016-12-24 (×2): qty 0.4

## 2016-12-24 MED ORDER — SODIUM CHLORIDE 0.9 % IV SOLN
Freq: Once | INTRAVENOUS | Status: AC
  Administered 2016-12-24: 20:00:00 via INTRAVENOUS

## 2016-12-24 MED ORDER — LABETALOL HCL 5 MG/ML IV SOLN
10.0000 mg | INTRAVENOUS | Status: DC | PRN
  Filled 2016-12-24: qty 4

## 2016-12-24 MED ORDER — FAMOTIDINE 20 MG PO TABS
20.0000 mg | ORAL_TABLET | Freq: Two times a day (BID) | ORAL | Status: DC
  Administered 2016-12-25 – 2016-12-26 (×3): 20 mg via ORAL
  Filled 2016-12-24 (×4): qty 1

## 2016-12-24 MED ORDER — ONDANSETRON HCL 4 MG PO TABS: 4.0000 mg | ORAL_TABLET | Freq: Four times a day (QID) | ORAL | Status: DC | PRN

## 2016-12-24 MED ORDER — ACETAMINOPHEN 650 MG RE SUPP: 650.0000 mg | Freq: Four times a day (QID) | RECTAL | Status: DC | PRN

## 2016-12-24 NOTE — H&P (Signed)
[facility] [place] [facility]   PATIENT NAME: Craig AquasJoseph Powell    MR#:  [id number]  DATE OF BIRTH:  23-Jan-1981  DATE OF ADMISSION:  12/24/2016  PRIMARY CARE PHYSICIAN: Lonie PeakNathan Conroy, PA-C   REQUESTING/REFERRING PHYSICIAN: Lenard LancePaduchowski, MD  CHIEF COMPLAINT:   Chief Complaint  Patient presents with  . Extremity Weakness    HISTORY OF PRESENT ILLNESS:  Craig Powell  is a 35 y.o. male who presents with Dark colored urine. Patient states that he began an exercise routine after not having exercised for a long time. Then his urine became very dark. He came in for evaluation. Here he was found have significant rhabdomyolysis with CPK in the ED greater than 50,000. He also has a little bit of AKI.  Hospitals were called for admission  PAST MEDICAL HISTORY:   Past Medical History:  Diagnosis Date  . Essential hypertension 04/22/2016  . Hyperlipidemia   . Hypertension   . Obesity (BMI 30.0-34.9) 04/22/2016    PAST SURGICAL HISTORY:   Past Surgical History:  Procedure Laterality Date  . HERNIA REPAIR     umbilical, and right groin    SOCIAL HISTORY:   Social History  Substance Use Topics  . Smoking status: Never Smoker  . Smokeless tobacco: Never Used  . Alcohol use No    FAMILY HISTORY:   Family History  Problem Relation Age of Onset  . Hypertension Father   . Hypertension Maternal Grandfather   . Heart attack Maternal Grandfather   . Cancer Paternal Grandmother   . Hypertension Paternal Grandfather   . Heart attack Paternal Grandfather   . Colon cancer Neg Hx     DRUG ALLERGIES:  No Known Allergies  MEDICATIONS AT HOME:   Prior to Admission medications   Medication Sig Start Date End Date Taking? Authorizing Provider  losartan (COZAAR) 100 MG tablet Take 1 tablet (100 mg total) by mouth daily. 08/17/16  Yes Chilton Siiffany [place], MD  pravastatin (PRAVACHOL) 40 MG tablet Take 40 mg by mouth daily.   Yes Historical Provider, MD   ranitidine (ZANTAC) 150 MG tablet Take 150 mg by mouth 2 (two) times daily.   Yes Historical Provider, MD  pravastatin (PRAVACHOL) 40 MG tablet Take 1 tablet (40 mg total) by mouth every evening. 08/17/16 11/15/16  Chilton Siiffany [place], MD    REVIEW OF SYSTEMS:  Review of Systems  Constitutional: Negative for chills, fever, malaise/fatigue and weight loss.  HENT: Negative for ear pain, hearing loss and tinnitus.   Eyes: Negative for blurred vision, double vision, pain and redness.  Respiratory: Negative for cough, hemoptysis and shortness of breath.   Cardiovascular: Negative for chest pain, palpitations, orthopnea and leg swelling.  Gastrointestinal: Negative for abdominal pain, constipation, diarrhea, nausea and vomiting.  Genitourinary: Negative for dysuria, frequency and hematuria.  Musculoskeletal: Positive for myalgias. Negative for back pain, joint pain and neck pain.  Skin:       No acne, rash, or lesions  Neurological: Negative for dizziness, tremors, focal weakness and weakness.  Endo/Heme/Allergies: Negative for polydipsia. Does not bruise/bleed easily.  Psychiatric/Behavioral: Negative for depression. The patient is not nervous/anxious and does not have insomnia.      VITAL SIGNS:   Vitals:   12/24/16 1654 12/24/16 1957  BP: (!) 163/94 (!) 139/94  Pulse: 89 74  Resp: 18 18  Temp: 98 F (36.7 C)   TempSrc: Oral   SpO2: 97% 96%  Weight: 90.7 kg (200 lb)   Height: 5\' 7"  (1.702  m)    Wt Readings from Last 3 Encounters:  12/24/16 90.7 kg (200 lb)  08/17/16 91.7 kg (202 lb 3.2 oz)  05/28/16 92.1 kg (203 lb)    PHYSICAL EXAMINATION:  Physical Exam  Vitals reviewed. Constitutional: He is oriented to person, place, and time. He appears well-developed and well-nourished. No distress.  HENT:  Head: Normocephalic and atraumatic.  Mouth/Throat: Oropharynx is clear and moist.  Eyes: Conjunctivae and EOM are normal. Pupils are equal, round, and reactive to light. No scleral  icterus.  Neck: Normal range of motion. Neck supple. No JVD present. No thyromegaly present.  Cardiovascular: Normal rate, regular rhythm and intact distal pulses.  Exam reveals no gallop and no friction rub.   No murmur heard. Respiratory: Effort normal and breath sounds normal. No respiratory distress. He has no wheezes. He has no rales.  GI: Soft. Bowel sounds are normal. He exhibits no distension. There is no tenderness.  Musculoskeletal: Normal range of motion. He exhibits tenderness (bilateral thigh muscles). He exhibits no edema.  No arthritis, no gout  Lymphadenopathy:    He has no cervical adenopathy.  Neurological: He is alert and oriented to person, place, and time. No cranial nerve deficit.  No dysarthria, no aphasia  Skin: Skin is warm and dry. No rash noted. No erythema.  Psychiatric: He has a normal mood and affect. His behavior is normal. Judgment and thought content normal.    LABORATORY PANEL:   CBC  Recent Labs Lab 12/24/16 1703  WBC 12.6*  HGB 15.5  HCT 45.3  PLT 342   ------------------------------------------------------------------------------------------------------------------  Chemistries   Recent Labs Lab 12/24/16 1703  NA 136  K 3.8  CL 102  CO2 23  GLUCOSE 118*  BUN 29*  CREATININE 2.00*  CALCIUM 9.2  AST 828*  ALT 157*  ALKPHOS 53  BILITOT 0.6   ------------------------------------------------------------------------------------------------------------------  Cardiac Enzymes No results for input(s): TROPONINI in the last 168 hours. ------------------------------------------------------------------------------------------------------------------  RADIOLOGY:  No results found.  EKG:   Orders placed or performed in visit on 04/20/16  . EKG 12-Lead    IMPRESSION AND PLAN:  Principal Problem:   Rhabdomyolysis - CPK greater than 50,000, we will give him aggressive IV fluids and recheck this value in the morning. Active  Problems:   AKI (acute kidney injury) (HCC) - likely due to the rhabdo, IV fluids as above and monitor this closely, avoid nephrotoxins   Essential hypertension - currently stable and normotensive, hold home dose losartan given his acute kidney injury above, continues when necessary antihypertensives if his blood pressure rises.   Hyperlipidemia - continue home meds  All the records are reviewed and case discussed with ED provider. Management plans discussed with the patient and/or family.  DVT PROPHYLAXIS: SubQ heparin  GI PROPHYLAXIS: None  ADMISSION STATUS: Inpatient  CODE STATUS: Full Code Status History    This patient does not have a recorded code status. Please follow your organizational policy for patients in this situation.      TOTAL TIME TAKING CARE OF THIS PATIENT: 45 minutes.    Irvan Tiedt FIELDING 12/24/2016, 9:43 PM  [facility]  Office  [phone]  CC: Primary care physician; Lonie PeakNathan Conroy, PA-C

## 2016-12-24 NOTE — ED Triage Notes (Signed)
Pt states that he worked out for the first time in a long time Wednesday, then violently vomited, pt states that he has had severe muscle soreness as well as foamy dark urine since

## 2016-12-24 NOTE — ED Provider Notes (Signed)
[facility] Emergency Department Provider Note  Time seen: 8:40 PM  I have reviewed the triage vital signs and the nursing notes.   HISTORY  Chief Complaint Extremity Weakness    HPI Craig Powell is a 35 y.o. male with a past medical history of hypertension, who presents to the emergency department for dark colored urine and muscle pain. According to the patient he has not worked out in a very long time, 3 days ago the patientworked out very heavily doing a lot of lower body exercises. He states shortly after working out he vomited. He proceeded to vomit the next day as well. States his muscles has been extremely sore. Since last night he has been peeing very dark-colored urine. Patient was concerned so he came to the emergency department for evaluation. Denies any abdominal pain. Denies any current nausea or vomiting.  Past Medical History:  Diagnosis Date  . Essential hypertension 04/22/2016  . Hyperlipidemia   . Hypertension   . Obesity (BMI 30.0-34.9) 04/22/2016    Patient Active Problem List   Diagnosis Date Noted  . Essential hypertension 04/22/2016  . Hyperlipidemia 04/22/2016  . Obesity (BMI 30.0-34.9) 04/22/2016    Past Surgical History:  Procedure Laterality Date  . HERNIA REPAIR     umbilical, and right groin    Prior to Admission medications   Medication Sig Start Date End Date Taking? Authorizing Provider  losartan (COZAAR) 100 MG tablet Take 1 tablet (100 mg total) by mouth daily. 08/17/16   Chilton Siiffany [place], MD  omeprazole (PRILOSEC) 40 MG capsule Take 1 capsule by mouth daily. 03/30/16   Historical Provider, MD  pravastatin (PRAVACHOL) 40 MG tablet Take 1 tablet (40 mg total) by mouth every evening. 08/17/16 11/15/16  Chilton Siiffany [place], MD    No Known Allergies  Family History  Problem Relation Age of Onset  . Hypertension Father   . Hypertension Maternal Grandfather   . Heart attack Maternal Grandfather   . Cancer Paternal  Grandmother   . Hypertension Paternal Grandfather   . Heart attack Paternal Grandfather   . Colon cancer Neg Hx     Social History Social History  Substance Use Topics  . Smoking status: Never Smoker  . Smokeless tobacco: Never Used  . Alcohol use No    Review of Systems Constitutional: Negative for fever. Cardiovascular: Negative for chest pain. Respiratory: Negative for shortness of breath. Gastrointestinal: Negative for abdominal pain. Positive for nausea and vomiting which have now resolved. Genitourinary: Negative for dysuria. Positive for very dark urine. Neurological: Negative for headache 10-point ROS otherwise negative.  ____________________________________________   PHYSICAL EXAM:  VITAL SIGNS: ED Triage Vitals [12/24/16 1654]  Enc Vitals Group     BP (!) 163/94     Pulse Rate 89     Resp 18     Temp 98 F (36.7 C)     Temp Source Oral     SpO2 97 %     Weight 200 lb (90.7 kg)     Height 5\' 7"  (1.702 m)     Head Circumference      Peak Flow      Pain Score 6     Pain Loc      Pain Edu?      Excl. in GC?    Constitutional: Alert and oriented. Well appearing and in no distress. Eyes: Normal exam ENT   Head: Normocephalic and atraumatic.   Mouth/Throat: Mucous membranes are moist. Cardiovascular: Normal rate, regular rhythm. No  murmur Respiratory: Normal respiratory effort without tachypnea nor retractions. Breath sounds are clear Gastrointestinal: Soft and nontender. No distention. Musculoskeletal: Source/tender extremities. Neurologic:  Normal speech and language. No gross focal neurologic deficits Skin:  Skin is warm, dry and intact.  Psychiatric: Mood and affect are normal.   ____________________________________________   INITIAL IMPRESSION / ASSESSMENT AND PLAN / ED COURSE  Pertinent labs & imaging results that were available during my care of the patient were reviewed by me and considered in my medical decision making (see chart for  details).  Patient presents the emergency department with sore muscles and dark urine. Highly suspect rhabdomyolysis. Patient is receiving 3 L of fluid in the emergency department. Creatinine has increased from normal to 2.0. Currently awaiting CK result is they are manually diluting it.  CK is resulted greater than 50,000. We will continue with IV hydration. Patient will be admitted to the hospital for further treatment.  ____________________________________________   FINAL CLINICAL IMPRESSION(S) / ED DIAGNOSES  Rhabdomyolysis    Minna AntisKevin Maryjean Corpening, MD 12/24/16 2114

## 2016-12-25 LAB — BASIC METABOLIC PANEL
Anion gap: 4 — ABNORMAL LOW (ref 5–15)
BUN: 25 mg/dL — ABNORMAL HIGH (ref 6–20)
CO2: 25 mmol/L (ref 22–32)
Calcium: 8.2 mg/dL — ABNORMAL LOW (ref 8.9–10.3)
Chloride: 112 mmol/L — ABNORMAL HIGH (ref 101–111)
Creatinine, Ser: 1.69 mg/dL — ABNORMAL HIGH (ref 0.61–1.24)
GFR calc Af Amer: 60 mL/min — ABNORMAL LOW (ref >60)
GFR calc non Af Amer: 52 mL/min — ABNORMAL LOW (ref >60)
Glucose, Bld: 104 mg/dL — ABNORMAL HIGH (ref 65–99)
Potassium: 4.5 mmol/L (ref 3.5–5.1)
Sodium: 141 mmol/L (ref 135–145)

## 2016-12-25 LAB — CBC
HCT: 38.4 % — ABNORMAL LOW (ref 40.0–52.0)
Hemoglobin: 13.2 g/dL (ref 13.0–18.0)
MCH: 28.6 pg (ref 26.0–34.0)
MCHC: 34.4 g/dL (ref 32.0–36.0)
MCV: 83.1 fL (ref 80.0–100.0)
PLATELETS: 258 10*3/uL (ref 150–440)
RBC: 4.61 MIL/uL (ref 4.40–5.90)
RDW: 13 % (ref 11.5–14.5)
WBC: 10 10*3/uL (ref 3.8–10.6)

## 2016-12-25 LAB — CK: Total CK: >50000 U/L — ABNORMAL HIGH (ref 49–397)

## 2016-12-25 MED ORDER — GUAIFENESIN-DM 100-10 MG/5ML PO SYRP
5.0000 mL | ORAL_SOLUTION | ORAL | Status: DC | PRN
  Administered 2016-12-25: 5 mL via ORAL
  Filled 2016-12-25: qty 5

## 2016-12-25 NOTE — Progress Notes (Signed)
[facility] [place] at [facility]   PATIENT NAME: Craig Powell Moller    MRN#:  [id number]  DATE OF BIRTH:  March 09, 1981  SUBJECTIVE:  Hospital Day: 1 day Craig Powell Judy is a 35 y.o. male presenting with Extremity Weakness .   Overnight events: No acute overnight events Interval Events: No complaints at this time,, urine starting to clear  REVIEW OF SYSTEMS:  CONSTITUTIONAL: No fever, fatigue or weakness.  EYES: No blurred or double vision.  EARS, NOSE, AND THROAT: No tinnitus or ear pain.  RESPIRATORY: No cough, shortness of breath, wheezing or hemoptysis.  CARDIOVASCULAR: No chest pain, orthopnea, edema.  GASTROINTESTINAL: No nausea, vomiting, diarrhea or abdominal pain.  GENITOURINARY: No dysuria, hematuria.  ENDOCRINE: No polyuria, nocturia,  HEMATOLOGY: No anemia, easy bruising or bleeding SKIN: No rash or lesion. MUSCULOSKELETAL: No joint pain or arthritis.   NEUROLOGIC: No tingling, numbness, weakness.  PSYCHIATRY: No anxiety or depression.   DRUG ALLERGIES:  No Known Allergies  VITALS:  Blood pressure (!) 141/75, pulse 76, temperature 98.4 F (36.9 C), temperature source Oral, resp. rate 18, height 5\' 7"  (1.702 m), weight 93.4 kg (205 lb 12.8 oz), SpO2 99 %.  PHYSICAL EXAMINATION:  VITAL SIGNS: Vitals:   12/25/16 0815 12/25/16 1323  BP: (!) 143/83 (!) 141/75  Pulse: 74 76  Resp: 18 18  Temp: 97.9 F (36.6 C) 98.4 F (36.9 C)   GENERAL:35 y.o.male currently in no acute distress.  HEAD: Normocephalic, atraumatic.  EYES: Pupils equal, round, reactive to light. Extraocular muscles intact. No scleral icterus.  MOUTH: Moist mucosal membrane. Dentition intact. No abscess noted.  EAR, NOSE, THROAT: Clear without exudates. No external lesions.  NECK: Supple. No thyromegaly. No nodules. No JVD.  PULMONARY: Clear to ascultation, without wheeze rails or rhonci. No use of accessory muscles, Good respiratory effort. good air entry  bilaterally CHEST: Nontender to palpation.  CARDIOVASCULAR: S1 and S2. Regular rate and rhythm. No murmurs, rubs, or gallops. No edema. Pedal pulses 2+ bilaterally.  GASTROINTESTINAL: Soft, nontender, nondistended. No masses. Positive bowel sounds. No hepatosplenomegaly.  MUSCULOSKELETAL: No swelling, clubbing, or edema. Range of motion full in all extremities.  NEUROLOGIC: Cranial nerves II through XII are intact. No gross focal neurological deficits. Sensation intact. Reflexes intact.  SKIN: No ulceration, lesions, rashes, or cyanosis. Skin warm and dry. Turgor intact.  PSYCHIATRIC: Mood, affect within normal limits. The patient is awake, alert and oriented x 3. Insight, judgment intact.      LABORATORY PANEL:   CBC  Recent Labs Lab 12/25/16 0440  WBC 10.0  HGB 13.2  HCT 38.4*  PLT 258   ------------------------------------------------------------------------------------------------------------------  Chemistries   Recent Labs Lab 12/24/16 1703 12/25/16 0440  NA 136 141  K 3.8 4.5  CL 102 112*  CO2 23 25  GLUCOSE 118* 104*  BUN 29* 25*  CREATININE 2.00* 1.69*  CALCIUM 9.2 8.2*  AST 828*  --   ALT 157*  --   ALKPHOS 53  --   BILITOT 0.6  --    ------------------------------------------------------------------------------------------------------------------  Cardiac Enzymes No results for input(s): TROPONINI in the last 168 hours. ------------------------------------------------------------------------------------------------------------------  RADIOLOGY:  No results found.  EKG:   Orders placed or performed in visit on 04/20/16  . EKG 12-Lead    ASSESSMENT AND PLAN:   Craig Powell Karle is a 35 y.o. male presenting with Extremity Weakness . Admitted 12/24/2016 : Day #: 1 day   1. Rhabdomyolysis: CK still remarkably elevated, continue IV fluids, recheck CK in the  morning 2. Acute kidney injury: Improved 3. Essential hypertension continue home  medications 4. Hyperlipidemia unspecified: Hold statins given #1  All the records are reviewed and case discussed with Care Management/Social Workerr. Management plans discussed with the patient, family and they are in agreement.  CODE STATUS: full TOTAL TIME TAKING CARE OF THIS PATIENT: 28 minutes.   POSSIBLE D/C IN 1DAYS, DEPENDING ON CLINICAL CONDITION.   Almon Whitford,  Mardi MainlandDavid K M.D on 12/25/2016 at 2:59 PM  Between 7am to 6pm - Pager - [phone]  After 6pm: House Pager: - [phone]  [facility]  Office  [phone]  CC: Primary care physician; Lonie PeakNathan Conroy, PA-C

## 2016-12-26 LAB — BASIC METABOLIC PANEL
Anion gap: 5 (ref 5–15)
BUN: 15 mg/dL (ref 6–20)
CHLORIDE: 112 mmol/L — AB (ref 101–111)
CO2: 24 mmol/L (ref 22–32)
Calcium: 8.6 mg/dL — ABNORMAL LOW (ref 8.9–10.3)
Creatinine, Ser: 1.4 mg/dL — ABNORMAL HIGH (ref 0.61–1.24)
GFR calc non Af Amer: >60 mL/min (ref >60)
Glucose, Bld: 100 mg/dL — ABNORMAL HIGH (ref 65–99)
POTASSIUM: 4.6 mmol/L (ref 3.5–5.1)
SODIUM: 141 mmol/L (ref 135–145)

## 2016-12-26 LAB — CK: Total CK: >50000 U/L — ABNORMAL HIGH (ref 49–397)

## 2016-12-26 NOTE — Progress Notes (Signed)
[facility] [place] at [facility]   PATIENT NAME: Craig Powell    MRN#:  [id number]  DATE OF BIRTH:  12/22/1981  SUBJECTIVE:  Hospital Day: 2 days Craig Powell is a 35 y.o. male presenting with Extremity Weakness .   Overnight events: No acute overnight events Interval Events: No complaints at this time,, urine starting to clear, Leg pain improved  REVIEW OF SYSTEMS:  CONSTITUTIONAL: No fever, fatigue or weakness.  EYES: No blurred or double vision.  EARS, NOSE, AND THROAT: No tinnitus or ear pain.  RESPIRATORY: No cough, shortness of breath, wheezing or hemoptysis.  CARDIOVASCULAR: No chest pain, orthopnea, edema.  GASTROINTESTINAL: No nausea, vomiting, diarrhea or abdominal pain.  GENITOURINARY: No dysuria, hematuria.  ENDOCRINE: No polyuria, nocturia,  HEMATOLOGY: No anemia, easy bruising or bleeding SKIN: No rash or lesion. MUSCULOSKELETAL: No joint pain or arthritis.   NEUROLOGIC: No tingling, numbness, weakness.  PSYCHIATRY: No anxiety or depression.   DRUG ALLERGIES:  No Known Allergies  VITALS:  Blood pressure 138/76, pulse 74, temperature 97.5 F (36.4 C), temperature source Oral, resp. rate 18, height 5\' 7"  (1.702 m), weight 93.4 kg (205 lb 12.8 oz), SpO2 96 %.  PHYSICAL EXAMINATION:  VITAL SIGNS: Vitals:   12/26/16 0359 12/26/16 1048  BP: 135/87 138/76  Pulse: 64 74  Resp: 20 18  Temp: 98.2 F (36.8 C) 97.5 F (36.4 C)   GENERAL:35 y.o.male currently in no acute distress.  HEAD: Normocephalic, atraumatic.  EYES: Pupils equal, round, reactive to light. Extraocular muscles intact. No scleral icterus.  MOUTH: Moist mucosal membrane. Dentition intact. No abscess noted.  EAR, NOSE, THROAT: Clear without exudates. No external lesions.  NECK: Supple. No thyromegaly. No nodules. No JVD.  PULMONARY: Clear to ascultation, without wheeze rails or rhonci. No use of accessory muscles, Good respiratory effort. good air entry  bilaterally CHEST: Nontender to palpation.  CARDIOVASCULAR: S1 and S2. Regular rate and rhythm. No murmurs, rubs, or gallops. No edema. Pedal pulses 2+ bilaterally.  GASTROINTESTINAL: Soft, nontender, nondistended. No masses. Positive bowel sounds. No hepatosplenomegaly.  MUSCULOSKELETAL: No swelling, clubbing, or edema. Range of motion full in all extremities.  NEUROLOGIC: Cranial nerves II through XII are intact. No gross focal neurological deficits. Sensation intact. Reflexes intact.  SKIN: No ulceration, lesions, rashes, or cyanosis. Skin warm and dry. Turgor intact.  PSYCHIATRIC: Mood, affect within normal limits. The patient is awake, alert and oriented x 3. Insight, judgment intact.      LABORATORY PANEL:   CBC  Recent Labs Lab 12/25/16 0440  WBC 10.0  HGB 13.2  HCT 38.4*  PLT 258   ------------------------------------------------------------------------------------------------------------------  Chemistries   Recent Labs Lab 12/24/16 1703  12/26/16 0435  NA 136  < > 141  K 3.8  < > 4.6  CL 102  < > 112*  CO2 23  < > 24  GLUCOSE 118*  < > 100*  BUN 29*  < > 15  CREATININE 2.00*  < > 1.40*  CALCIUM 9.2  < > 8.6*  AST 828*  --   --   ALT 157*  --   --   ALKPHOS 53  --   --   BILITOT 0.6  --   --   < > = values in this interval not displayed. ------------------------------------------------------------------------------------------------------------------  Cardiac Enzymes No results for input(s): TROPONINI in the last 168 hours. ------------------------------------------------------------------------------------------------------------------  RADIOLOGY:  No results found.  EKG:   Orders placed or performed in visit on 04/20/16  .  EKG 12-Lead    ASSESSMENT AND PLAN:   Craig Powell is a 35 y.o. male presenting with Extremity Weakness . Admitted 12/24/2016 : Day #: 2 days   1. Rhabdomyolysis: CK still remarkably elevated (too high to calculate),  continue IV fluids, recheck CK in the morning, I suspect there is a downward trend and would feel comfortable in discharging this otherwise healthy patient once we can actually prove it is downward trending, even if not normalized  2. Acute kidney injury: Resolved 3. Essential hypertension continue home medications 4. Hyperlipidemia unspecified: Hold statins given #1  All the records are reviewed and case discussed with Care Management/Social Workerr. Management plans discussed with the patient, family and they are in agreement.  CODE STATUS: full TOTAL TIME TAKING CARE OF THIS PATIENT: 28 minutes.   POSSIBLE D/C IN 1DAYS, DEPENDING ON CLINICAL CONDITION.   Hower,  Mardi MainlandDavid K M.D on 12/26/2016 at 11:24 AM  Between 7am to 6pm - Pager - [phone]  After 6pm: House Pager: [phone]  [facility]  Office  [phone]  CC: Primary care physician; Lonie PeakNathan Conroy, PA-C

## 2016-12-27 LAB — BASIC METABOLIC PANEL
Anion gap: 7 (ref 5–15)
BUN: 14 mg/dL (ref 6–20)
CALCIUM: 8.7 mg/dL — AB (ref 8.9–10.3)
CO2: 24 mmol/L (ref 22–32)
CREATININE: 1.12 mg/dL (ref 0.61–1.24)
Chloride: 110 mmol/L (ref 101–111)
GFR calc non Af Amer: >60 mL/min (ref >60)
GLUCOSE: 104 mg/dL — AB (ref 65–99)
Potassium: 4.3 mmol/L (ref 3.5–5.1)
Sodium: 141 mmol/L (ref 135–145)

## 2016-12-27 LAB — CK: Total CK: 49934 U/L — ABNORMAL HIGH (ref 49–397)

## 2016-12-27 NOTE — Discharge Summary (Signed)
[facility] [place] at [facility]   PATIENT NAME: Craig Powell    MR#:  [id number]  DATE OF BIRTH:  07/20/1981  DATE OF ADMISSION:  12/24/2016 ADMITTING PHYSICIAN: Oralia Manisavid Willis, MD  DATE OF DISCHARGE: 12/27/2016 12:19 PM  PRIMARY CARE PHYSICIAN: Lonie PeakNathan Conroy, PA-C    ADMISSION DIAGNOSIS:  Acute renal insufficiency [N28.9] Non-traumatic rhabdomyolysis [M62.82]  DISCHARGE DIAGNOSIS:  Principal Problem:   Rhabdomyolysis Active Problems:   Essential hypertension   Hyperlipidemia   AKI (acute kidney injury) (HCC)   SECONDARY DIAGNOSIS:   Past Medical History:  Diagnosis Date  . Essential hypertension 04/22/2016  . Hyperlipidemia   . Hypertension   . Obesity (BMI 30.0-34.9) 04/22/2016    HOSPITAL COURSE:   36 year old male with past medical history of hypertension, hyperlipidemia who presented to the hospital due to rhabdomyolysis and acute kidney injury.  1. Acute rhabdomyolysis-this was secondary to excessive exercise and possibly complicated with underlying use of statins. -Patient was aggressively hydrated with IV fluids and his CKs have started to trend down. He still has significantly elevated CKs but is clinically asymptomatic and feels better. He's being discharged home and advised to aggressively hydrate and avoid using a statin until his symptoms and CKs have normalized.    2. Acute kidney injury-secondary to the acute rhabdomyolysis. Patient was aggressively hydrated with IV fluids and his creatinine has since then improved and is close to baseline. -His losartan was discontinued but he can resume that upon discharge as his renal function is improved.  3. Essential hypertension-patient will resume his losartan.  4. Hyperlipidemia-patient is to indefinitely stop his statin until his CKs improve and he follows up with his primary care physician.  DISCHARGE CONDITIONS:   stable  CONSULTS OBTAINED:    DRUG ALLERGIES:  No Known  Allergies  DISCHARGE MEDICATIONS:   Allergies as of 12/27/2016   No Known Allergies     Medication List    STOP taking these medications   pravastatin 40 MG tablet Commonly known as:  PRAVACHOL     TAKE these medications   losartan 100 MG tablet Commonly known as:  COZAAR Take 1 tablet (100 mg total) by mouth daily.   ranitidine 150 MG tablet Commonly known as:  ZANTAC Take 150 mg by mouth 2 (two) times daily.         DISCHARGE INSTRUCTIONS:   DIET:  Cardiac diet  DISCHARGE CONDITION:  Stable  ACTIVITY:  Activity as tolerated  OXYGEN:  Home Oxygen: No.   Oxygen Delivery: room air  DISCHARGE LOCATION:  home   If you experience worsening of your admission symptoms, develop shortness of breath, life threatening emergency, suicidal or homicidal thoughts you must seek medical attention immediately by calling 911 or calling your MD immediately  if symptoms less severe.  You Must read complete instructions/literature along with all the possible adverse reactions/side effects for all the Medicines you take and that have been prescribed to you. Take any new Medicines after you have completely understood and accpet all the possible adverse reactions/side effects.   Please note  You were cared for by a hospitalist during your hospital stay. If you have any questions about your discharge medications or the care you received while you were in the hospital after you are discharged, you can call the unit and asked to speak with the hospitalist on call if the hospitalist that took care of you is not available. Once you are discharged, your primary care physician will handle any further  medical issues. Please note that NO REFILLS for any discharge medications will be authorized once you are discharged, as it is imperative that you return to your primary care physician (or establish a relationship with a primary care physician if you do not have one) for your aftercare needs so that  they can reassess your need for medications and monitor your lab values.     Today   Still has some mild myalgias in the legs but overall feels much better. Wants to go Home. CK's still high but improved.    VITAL SIGNS:  Blood pressure (!) 154/84, pulse 61, temperature 97.8 F (36.6 C), temperature source Oral, resp. rate 18, height 5\' 7"  (1.702 m), weight 93.4 kg (205 lb 12.8 oz), SpO2 100 %.  I/O:   Intake/Output Summary (Last 24 hours) at 12/27/16 1712 Last data filed at 12/27/16 1005  Gross per 24 hour  Intake           2422.5 ml  Output              325 ml  Net           2097.5 ml    PHYSICAL EXAMINATION:  GENERAL:  36 y.o.-year-old patient lying in the bed with no acute distress.  EYES: Pupils equal, round, reactive to light and accommodation. No scleral icterus. Extraocular muscles intact.  HEENT: Head atraumatic, normocephalic. Oropharynx and nasopharynx clear.  NECK:  Supple, no jugular venous distention. No thyroid enlargement, no tenderness.  LUNGS: Normal breath sounds bilaterally, no wheezing, rales,rhonchi. No use of accessory muscles of respiration.  CARDIOVASCULAR: S1, S2 normal. No murmurs, rubs, or gallops.  ABDOMEN: Soft, non-tender, non-distended. Bowel sounds present. No organomegaly or mass.  EXTREMITIES: No pedal edema, cyanosis, or clubbing.  NEUROLOGIC: Cranial nerves II through XII are intact. No focal motor or sensory defecits b/l.  PSYCHIATRIC: The patient is alert and oriented x 3. Good affect.  SKIN: No obvious rash, lesion, or ulcer.   DATA REVIEW:   CBC  Recent Labs Lab 12/25/16 0440  WBC 10.0  HGB 13.2  HCT 38.4*  PLT 258    Chemistries   Recent Labs Lab 12/24/16 1703  12/27/16 0422  NA 136  < > 141  K 3.8  < > 4.3  CL 102  < > 110  CO2 23  < > 24  GLUCOSE 118*  < > 104*  BUN 29*  < > 14  CREATININE 2.00*  < > 1.12  CALCIUM 9.2  < > 8.7*  AST 828*  --   --   ALT 157*  --   --   ALKPHOS 53  --   --   BILITOT 0.6  --    --   < > = values in this interval not displayed.  Cardiac Enzymes No results for input(s): TROPONINI in the last 168 hours.   RADIOLOGY:  No results found.    Management plans discussed with the patient, family and they are in agreement.  CODE STATUS:  Code Status History    Date Active Date Inactive Code Status Order ID Comments User Context   12/24/2016 10:41 PM 12/27/2016  3:24 PM Full Code [id number]  Oralia Manis, MD ED      TOTAL TIME TAKING CARE OF THIS PATIENT: 40 minutes.    Houston Siren M.D on 12/27/2016 at 5:12 PM  Between 7am to 6pm - Pager - [phone]  After 6pm go to [email or web address] - password [email or web address] [facility]  [facility]  [place] [facility]  Office  [phone]  CC: Primary care physician; Lonie Peak, PA-C

## 2016-12-27 NOTE — Progress Notes (Signed)
Pt being discharged home today. PIV removed. Discharge instructions reviewed with pt and family member, all questions answered. He was educated on need to avoid strenuous activity for a few days and to drink at least 3-4L water/fluids per day. He will follow up as soon as available with PCP. He verbalized understanding and that if his conditions worsens he will return to [facility]. He is leaving with all his belongings, will be transported home via family.

## 2016-12-29 ENCOUNTER — Telehealth: Payer: Self-pay | Admitting: Cardiovascular Disease

## 2016-12-29 DIAGNOSIS — M6282 Rhabdomyolysis: Secondary | ICD-10-CM

## 2016-12-29 NOTE — Telephone Encounter (Signed)
Have confirmed w [facility] that the CK test will be accurate if total count <50,000

## 2016-12-29 NOTE — Telephone Encounter (Signed)
Pt has [employer] [facility] can no longer use [facility]. He wants to know what lab can he use now?

## 2016-12-29 NOTE — Telephone Encounter (Signed)
Note that I spoke to the patient. This was actually in regards to a post-hosp recheck of his CK and BMET. pt was discharged for non-traumatic rhabdo 2 days ago after CK elevations started to fall. He has discontinued statin.  He has appt for 01/01/16 to follow up w Dr. Duke Salviaandolph.  [facility] ([facility] w [facility] and [facility]). Pt aware.  Discussed w Juliette AlcideMelinda who agreed should be fine to order CK and BMET for recheck this week prior to appt. Dr. Duke Salviaandolph. At patient request will check w lab site to make sure their CK test will register values up to 50,000 before sending him.

## 2016-12-29 NOTE — Telephone Encounter (Signed)
Confirmed w pharmD CK and BMET are recommended tests.  Waiting on callback from [facility] - have left msg w [facility] location to call me back regarding CK assay thresholds.

## 2016-12-29 NOTE — Telephone Encounter (Signed)
Pt aware tests are ordered - I will reach back out to him if the lab notifies me they will not be able to perform assay to requested threshold. O/w he's aware to go tomorrow for test and follow up w us as scheduled on Friday.

## 2016-12-30 LAB — BASIC METABOLIC PANEL
BUN: 19 mg/dL (ref 7–25)
CALCIUM: 10.1 mg/dL (ref 8.6–10.3)
CO2: 27 mmol/L (ref 20–31)
CREATININE: 1.36 mg/dL — AB (ref 0.60–1.35)
Chloride: 100 mmol/L (ref 98–110)
Glucose, Bld: 95 mg/dL (ref 65–99)
Potassium: 4.5 mmol/L (ref 3.5–5.3)
Sodium: 139 mmol/L (ref 135–146)

## 2016-12-30 LAB — CK: CK TOTAL: 1807 U/L — AB (ref 7–232)

## 2016-12-31 ENCOUNTER — Encounter: Payer: Self-pay | Admitting: Cardiovascular Disease

## 2016-12-31 ENCOUNTER — Ambulatory Visit (INDEPENDENT_AMBULATORY_CARE_PROVIDER_SITE_OTHER): Payer: Commercial Managed Care - HMO | Admitting: Cardiovascular Disease

## 2016-12-31 VITALS — BP 140/84 | HR 64 | Ht 67.0 in | Wt 205.6 lb

## 2016-12-31 DIAGNOSIS — I10 Essential (primary) hypertension: Secondary | ICD-10-CM

## 2016-12-31 DIAGNOSIS — E7800 Pure hypercholesterolemia, unspecified: Secondary | ICD-10-CM | POA: Diagnosis not present

## 2016-12-31 DIAGNOSIS — M6282 Rhabdomyolysis: Secondary | ICD-10-CM | POA: Diagnosis not present

## 2016-12-31 MED ORDER — AMLODIPINE BESYLATE 5 MG PO TABS: 5.0000 mg | ORAL_TABLET | Freq: Every day | ORAL | 3 refills | Status: DC

## 2016-12-31 NOTE — Progress Notes (Signed)
Cardiology Office Note   Date:  12/31/2016   ID:  Craig Powell, DOB [birth year], MRN [id number]  PCP:  Craig Peak, PA-C  Cardiologist:   Craig Si, MD   Chief Complaint  Patient presents with  . Follow-up    Essential hypertension     History of Present Illness: Craig Powell is a 36 y.o. male with hypertension and hyperlipidemia who presents for follow-up. He reports borderline hypertension since he was in college. He started on losartan 04/20/16 and the dose was increased 07/2016. He was also started on pravastatin due to hyperlipidemia his concern about statins worsening his pre-diabetes.  Craig Powell reported atypical chest was and was referred for ETT on 04/30/16 that was negative for ischemia.  Since his last appointment he was seen in the ED 12/24/16 with acute renal failure and rhabdomyolysis.  He and his wife started an intense exercise program including 100 squats over a few minutes.  He immediately started throwing up and had severe pain.  The following day the pain was worse and the emesis persisted.  He was delirious and nearly passed out.  He then developed dark urine and presented to the ED where his CK was >50,000.  His creatinine was initially 2.  This was felt to be due to heavy exercise and use of statins.  Pravastatin was stopped and he received aggressive IV fluids with normalization of his renal function and downtrending CK.  Losartan was restarted at discharge.  Since discharge Mr. Craig Powell has been doing much better.  He has been hydrating and CK was down to 1,800 on recheck.  He has been checking his blood pressure since leaving the hospital.  In the morning it is 140-150/70-80.  But the aftenoon his diastolic is in the 90s and by the night it is 160/100.  He typically takes losartan in the evening.  He still has some leg pain but denies chest pain or shortness of breath. He had some LE edema after discharge but this has improved.  He denies orthopnea or PND.     Past Medical History:  Diagnosis Date  . Essential hypertension 04/22/2016  . Hyperlipidemia   . Hypertension   . Obesity (BMI 30.0-34.9) 04/22/2016    Past Surgical History:  Procedure Laterality Date  . HERNIA REPAIR     umbilical, and right groin    Current Outpatient Prescriptions  Medication Sig Dispense Refill  . losartan (COZAAR) 100 MG tablet Take 100 mg by mouth as directed. 1/2 TABLET BY MOUTH DAILY    . ranitidine (ZANTAC) 150 MG tablet Take 150 mg by mouth 2 (two) times daily.    Marland Kitchen amLODipine (NORVASC) 5 MG tablet Take 1 tablet (5 mg total) by mouth daily. 90 tablet 3   No current facility-administered medications for this visit.     Allergies:   Patient has no known allergies.    Social History:  The patient  reports that he has never smoked. He has never used smokeless tobacco. He reports that he does not drink alcohol or use drugs.   Family History:  The patient's family history includes Cancer in his paternal grandmother; Heart attack in his maternal grandfather and paternal grandfather; Hypertension in his father, maternal grandfather, and paternal grandfather.    ROS:  Please see the history of present illness.  Otherwise, review of systems are positive for none.   All other systems are reviewed and negative.    PHYSICAL EXAM: VS:  BP 140/84   Pulse  64   Ht 5\' 7"  (1.702 m)   Wt 93.3 kg (205 lb 9.6 oz)   BMI 32.20 kg/m  , BMI Body mass index is 32.2 kg/m. GENERAL:  Well appearing HEENT:  Pupils equal round and reactive, fundi not visualized, oral mucosa unremarkable NECK:  No jugular venous distention, waveform within normal limits, carotid upstroke brisk and symmetric, no bruits, no thyromegaly LYMPHATICS:  No cervical adenopathy LUNGS:  Clear to auscultation bilaterally HEART:  RRR.  PMI not displaced or sustained,S1 and S2 within normal limits, no S3, no S4, no clicks, no rubs, no murmurs CHEST: No chest wall tenderness to palpation  ABD:  Flat,  positive bowel sounds normal in frequency in pitch, no bruits, no rebound, no guarding, no midline pulsatile mass, no hepatomegaly, no splenomegaly EXT:  2 plus pulses throughout, no edema, no cyanosis no clubbing SKIN:  No rashes no nodules NEURO:  Cranial nerves II through XII grossly intact, motor grossly intact throughout PSYCH:  Cognitively intact, oriented to person place and time  EKG:  EKG is not ordered today. The ekg ordered 04/20/16 demonstrates sinus rhythm. 80 bpm. Cannot rule out prior inferior MI.  ETT 04/30/16:   Blood pressure demonstrated a hypertensive response to exercise.  There was no ST segment deviation noted during stress.  Normal exercise tolerance test at execellent work of 13.7 mets and adequate heart rate response.  Lowr risk study.   Recent Labs: 12/24/2016: ALT 157 12/25/2016: Hemoglobin 13.2; Platelets 258 12/29/2016: BUN 19; Creat 1.36; Potassium 4.5; Sodium 139   Lipid Panel    Component Value Date/Time   CHOL 158 10/07/2016 0941   TRIG 129 10/07/2016 0941   HDL 35 (L) 10/07/2016 0941   CHOLHDL 4.5 10/07/2016 0941   VLDL 26 10/07/2016 0941   LDLCALC 97 10/07/2016 0941    07/27/16: Total cholesterol 242, triglycerides 161, HDL 39, LDL 171. Soaking this seemed  Wt Readings from Last 3 Encounters:  12/31/16 93.3 kg (205 lb 9.6 oz)  12/24/16 93.4 kg (205 lb 12.8 oz)  08/17/16 91.7 kg (202 lb 3.2 oz)      ASSESSMENT AND PLAN:  # Hypertension: BP is not well-controlled.  On repeat his creatinine is up to 1.36.  We will decrease losartan and he will start amlodipine 5mg  daily.  He will continue checking his BP and increase amlodipine to 10mg  if is BP remains >130/80.  Repeat BMP in 1 month.  # Hyperlipidemia: Cholesterol levels were much better-controlled on pravastatin.  However, he developed severe rhabdomyolysis with exercise.  He will work on increasing his exercise and improving diet rather than re-challenging with a statin.  We will repeat  lipids 06/2017.  # Rhabdomyolysis: CK down to 1,800 from >50,000.  Encouraged continued hydration and reducing ARB as above.  # Chest pain: Resolved.  ETT was negative for ischemia.   Current medicines are reviewed at length with the patient today.  The patient does not have concerns regarding medicines.  The following changes have been made:  Reduce losartan to 50mg  and start amlodipine 5mg  daily.  Labs/ tests ordered today include:   No orders of the defined types were placed in this encounter.    Disposition:   FU with Tonesha Tsou C. Duke Salviaandolph, MD, [facility] in 1 year.    This note was written with the assistance of speech recognition software.  Please excuse any transcriptional errors.  Signed, Sumire Halbleib C. Duke Salviaandolph, MD, [facility]  12/31/2016 9:04 AM    [facility]

## 2016-12-31 NOTE — Patient Instructions (Addendum)
Medication Instructions:  DECREASE YOUR LOSARTAN TO 50 MG DAILY  START AMLODIPINE 5 MG DAILY   Labwork: NONE  Testing/Procedures: NONE  Follow-Up: Your physician recommends that you schedule a follow-up appointment in: 1 MONTH WITH PA/NP 01/28/17 AT 2:30 ARRIVE AT 2:15  Your physician wants you to follow-up in: 6 MONTH OV [facility] You will receive a reminder letter in the mail two months in advance. If you don't receive a letter, please call our office to schedule the follow-up appointment.  Any Other Special Instructions Will Be Listed Below (If Applicable). MONITOR YOUR BLOOD PRESSURE AT HOME AND IF YOUR BLOOD PRESSURE DOES NOT REMAIN BELOW 130/80 INCREASE AMLODIPINE TO 10 MG DAILY   If you need a refill on your cardiac medications before your next appointment, please call your pharmacy.

## 2017-01-28 ENCOUNTER — Encounter: Payer: Self-pay | Admitting: Physician Assistant

## 2017-01-28 ENCOUNTER — Ambulatory Visit (INDEPENDENT_AMBULATORY_CARE_PROVIDER_SITE_OTHER): Payer: Commercial Managed Care - HMO | Admitting: Physician Assistant

## 2017-01-28 VITALS — BP 136/82 | HR 69 | Ht 67.0 in | Wt 206.0 lb

## 2017-01-28 DIAGNOSIS — M6282 Rhabdomyolysis: Secondary | ICD-10-CM

## 2017-01-28 DIAGNOSIS — I10 Essential (primary) hypertension: Secondary | ICD-10-CM

## 2017-01-28 DIAGNOSIS — Z79899 Other long term (current) drug therapy: Secondary | ICD-10-CM | POA: Diagnosis not present

## 2017-01-28 LAB — BASIC METABOLIC PANEL
BUN: 13 mg/dL (ref 7–25)
CALCIUM: 9.9 mg/dL (ref 8.6–10.3)
CHLORIDE: 101 mmol/L (ref 98–110)
CO2: 28 mmol/L (ref 20–31)
CREATININE: 1.08 mg/dL (ref 0.60–1.35)
Glucose, Bld: 94 mg/dL (ref 65–99)
Potassium: 4.1 mmol/L (ref 3.5–5.3)
Sodium: 139 mmol/L (ref 135–146)

## 2017-01-28 MED ORDER — AMLODIPINE BESYLATE 5 MG PO TABS: 5.0000 mg | ORAL_TABLET | Freq: Two times a day (BID) | ORAL | 1 refills | Status: DC

## 2017-01-28 NOTE — Progress Notes (Signed)
Cardiology Office Note   Date:  01/28/2017   ID:  Craig Powell, DOB 1981/12/23, MRN [id number]  PCP:  Lonie Peak, PA-C  Cardiologist:  Dr. Duke Salvia, 12/31/2016  Theodore Demark, PA-C   Chief Complaint  Patient presents with  . Follow-up    one month appt - BP runnings 130s-140s/80s-100s    History of Present Illness: Craig Powell is a 36 y.o. male with a history of HTN, HLD, obesity, CP w/ nl ETT 04/2016, rhabdo 11/2016 w/ ARF after heavy exercise and on a statin w/ CK >50,000 (statin d/c'd)  12/31/2016 office visit with losartan decreased and amlodipine added, increased from 5 mg to 10 mg as necessary, creatinine improved to 1.36, CK 1800  Craig Powell presents for cardiology follow up  His BP has improved on the medication. He has not had chest pain. He has had some DOE, but this better.  His legs have improved. He could not do stairs for 3 weeks. He is now able to walk up to 2 miles at a time. He is trying to do this for exercise, but has to push himself. His legs get tired and he has some leg pain. His Plantar Fasciitis is starting to get bad again. He still has problems with leg pain and fatigue when he tries to increase the intensity of his activity, Even after brief time.  He has not had palpitations. He has not had lower extremity edema, orthopnea, or PND. He is still drinking plenty of water, but not struggling with this.    Past Medical History:  Diagnosis Date  . Essential hypertension 04/22/2016  . Hyperlipidemia   . Hypertension   . Obesity (BMI 30.0-34.9) 04/22/2016    Past Surgical History:  Procedure Laterality Date  . HERNIA REPAIR     umbilical, and right groin    Current Outpatient Prescriptions  Medication Sig Dispense Refill  . amLODipine (NORVASC) 5 MG tablet Take 1 tablet (5 mg total) by mouth daily. 90 tablet 3  . losartan (COZAAR) 100 MG tablet Take 100 mg by mouth as directed. 1/2 TABLET BY MOUTH DAILY    . ranitidine (ZANTAC) 150  MG tablet Take 150 mg by mouth 2 (two) times daily.     No current facility-administered medications for this visit.     Allergies:   Patient has no known allergies.    Social History:  The patient  reports that he has never smoked. He has never used smokeless tobacco. He reports that he does not drink alcohol or use drugs.   Family History:  The patient's family history includes Cancer in his paternal grandmother; Heart attack in his maternal grandfather and paternal grandfather; Hypertension in his father, maternal grandfather, and paternal grandfather.    ROS:  Please see the history of present illness. All other systems are reviewed and negative.    PHYSICAL EXAM: VS:  BP 136/82   Pulse 69   Ht 5\' 7"  (1.702 m)   Wt 206 lb (93.4 kg)   BMI 32.26 kg/m  , BMI Body mass index is 32.26 kg/m. GEN: Well nourished, well developed, male in no acute distress  HEENT: normal for age  Neck: no JVD, no carotid bruit, no masses Cardiac: RRR; no murmur, no rubs, or gallops Respiratory:  clear to auscultation bilaterally, normal work of breathing GI: soft, nontender, nondistended, + BS MS: no deformity or atrophy; no edema; distal pulses are 2+ in all 4 extremities   Skin: warm and dry, no rash  Neuro:  Strength and sensation are intact Psych: euthymic mood, full affect   EKG:  EKG is ordered today. The ekg ordered today demonstrates SR, HR 69, no ischemic changes, normal intervals   Recent Labs: 12/24/2016: ALT 157 12/25/2016: Hemoglobin 13.2; Platelets 258 12/29/2016: BUN 19; Creat 1.36; Potassium 4.5; Sodium 139    Lipid Panel    Component Value Date/Time   CHOL 158 10/07/2016 0941   TRIG 129 10/07/2016 0941   HDL 35 (L) 10/07/2016 0941   CHOLHDL 4.5 10/07/2016 0941   VLDL 26 10/07/2016 0941   LDLCALC 97 10/07/2016 0941     Wt Readings from Last 3 Encounters:  01/28/17 206 lb (93.4 kg)  12/31/16 205 lb 9.6 oz (93.3 kg)  12/24/16 205 lb 12.8 oz (93.4 kg)     Other  studies Reviewed: Additional studies/ records that were reviewed today include: hospital records, office notes and testing.  ASSESSMENT AND PLAN:  1.  Rhabdo: pt has continued To try to stay hydrated. His labs had improved on his previous office visit, I would like to confirm that this is continuing. We will check a BMET today and a CK.  2. Hypertension: His blood pressure could use better control. As he gets more active, it is likely to run higher. We will increase his amlodipine to 10 mg daily, he can start by taking his current tablets twice a day. If he does this and tolerates it well, he is to contact us and we will give him 10 mg tablets. If he has any side effects from the increased dose, he is to contact us.   Current medicines are reviewed at length with the patient today.  The patient does not have concerns regarding medicines.  The following changes have been made:  Increase amlodipine  Labs/ tests ordered today include:  No orders of the defined types were placed in this encounter.    Disposition:   FU with Dr. Duke Salviaandolph  Signed, Taylr Meuth, Deneen HartsRhonda, PA-C  01/28/2017 2:43 PM    [facility] Phone: [phone]; Fax: [phone]  This note was written with the assistance of speech recognition software. Please excuse any transcriptional errors.

## 2017-01-28 NOTE — Patient Instructions (Addendum)
Your physician recommends that you return for lab work TODAY BMET, CK  Your physician has recommended you make the following change in your medication:  -- INCREASE amlodipine to 5mg  twice daily  -- call if you tolerate this increase in BP medication   -- can change to 10mg  once daily if you prefer  You are due to see Dr. Duke Salviaandolph in August 2018 Please call if you need to be seen sooner You will get an email/MyChart notification when it is time to schedule an appointment

## 2017-01-29 LAB — CK: Total CK: 57 U/L (ref 7–232)

## 2017-02-07 ENCOUNTER — Encounter: Payer: Self-pay | Admitting: *Deleted

## 2017-02-25 DIAGNOSIS — L02411 Cutaneous abscess of right axilla: Secondary | ICD-10-CM | POA: Diagnosis not present

## 2017-02-25 DIAGNOSIS — J019 Acute sinusitis, unspecified: Secondary | ICD-10-CM | POA: Diagnosis not present

## 2017-03-02 ENCOUNTER — Other Ambulatory Visit: Payer: Self-pay | Admitting: Cardiovascular Disease

## 2017-03-02 DIAGNOSIS — J019 Acute sinusitis, unspecified: Secondary | ICD-10-CM | POA: Diagnosis not present

## 2017-03-02 DIAGNOSIS — L02411 Cutaneous abscess of right axilla: Secondary | ICD-10-CM | POA: Diagnosis not present

## 2017-03-02 MED ORDER — AMLODIPINE BESYLATE 5 MG PO TABS: 5.0000 mg | ORAL_TABLET | Freq: Two times a day (BID) | ORAL | 1 refills | Status: DC

## 2017-03-02 NOTE — Telephone Encounter (Signed)
New message   Pt is calling saying he is doing ok on new dosage and needs a new prescription.   *STAT* If patient is at the pharmacy, call can be transferred to refill team.   1. Which medications need to be refilled? (please list name of each medication and dose if known) amlodipine 5 mg  2. Which pharmacy/location (including street and city if local pharmacy) is medication to be sent to? [facility] [address] [place]   3. Do they need a 30 day or 90 day supply? 90 day

## 2017-03-02 NOTE — Telephone Encounter (Signed)
Rx(s) sent to pharmacy electronically.  

## 2017-04-07 ENCOUNTER — Telehealth: Payer: Self-pay | Admitting: Cardiovascular Disease

## 2017-04-07 NOTE — Telephone Encounter (Signed)
Only phone number on file is for patient, though we have DPR for his wife. I was unable to reach patient when I called, but left msg advising him to call back if he has a need to discuss.

## 2017-04-07 NOTE — Telephone Encounter (Signed)
New message    Pt wife called this morning to schedule appt with Dr. Duke Salvia for pt because he is having fluid retention and medication issues. She did not want me to send a message, she said he was about to leave to go out of town.

## 2017-04-11 ENCOUNTER — Encounter: Payer: Self-pay | Admitting: Cardiovascular Disease

## 2017-04-11 ENCOUNTER — Ambulatory Visit (INDEPENDENT_AMBULATORY_CARE_PROVIDER_SITE_OTHER): Payer: Commercial Managed Care - HMO | Admitting: Cardiovascular Disease

## 2017-04-11 VITALS — BP 136/62 | HR 76 | Ht 67.0 in | Wt 208.0 lb

## 2017-04-11 DIAGNOSIS — R079 Chest pain, unspecified: Secondary | ICD-10-CM

## 2017-04-11 DIAGNOSIS — E669 Obesity, unspecified: Secondary | ICD-10-CM

## 2017-04-11 DIAGNOSIS — I10 Essential (primary) hypertension: Secondary | ICD-10-CM

## 2017-04-11 DIAGNOSIS — E7800 Pure hypercholesterolemia, unspecified: Secondary | ICD-10-CM

## 2017-04-11 MED ORDER — HYDROCHLOROTHIAZIDE 12.5 MG PO CAPS
12.5000 mg | ORAL_CAPSULE | Freq: Every day | ORAL | 5 refills | Status: DC
Start: 2017-04-11 — End: 2017-10-13

## 2017-04-11 NOTE — Progress Notes (Signed)
Cardiology Office Note   Date:  04/11/2017   ID:  Craig Powell, DOB 1981/04/10, MRN [id number]  PCP:  Craig Peak, PA-C  Cardiologist:   Chilton Si, Craig Powell   Chief Complaint  Patient presents with  . Follow-up    pt reports lower extremity swelling     History of Present Illness: Craig Powell is a 36 y.o. male with hypertension and hyperlipidemia who presents for follow-up. He reports borderline hypertension since he was in college. He started on losartan 04/20/16 and the dose was increased 07/2016. He was also started on pravastatin due to hyperlipidemia his concern about statins worsening his pre-diabetes.  Craig Powell reported atypical chest was and was referred for ETT on 04/30/16 that was negative for ischemia.  He develope rhabdomyolysis with a CPK greater than 50,000. He was hospitalized and received IV fluid. He also had acute renal insufficiency. His creatinine improved to 100 time discharge. His blood pressures remained elevated so amlodipine was added to his regimen. The dose was increased to/2018. Synthroid he notes increasing lower extremity edema. It is worse in the evenings and worsened after increasing the amlodipine. He sometimes has a hard time sleeping at night due to pain and swelling in his legs. He denies orthopnea or PND. He also continues to have intermittent episodes of left-sided chest pain. This occurs both at rest and with exertion.  He also notes multiple upper respiratory infections. He does experience a lump under each arm. That seems to migrate from left-to-right.  Craig Powell reports that he hasn't been exercising much. He attributes this to leg pain. His diet is okay. His wife is currently on weight watchers, so he eats at least one healthy meal per day.   Past Medical History:  Diagnosis Date  . Essential hypertension 04/22/2016  . Hyperlipidemia   . Hypertension   . Obesity (BMI 30.0-34.9) 04/22/2016    Past Surgical History:  Procedure  Laterality Date  . HERNIA REPAIR     umbilical, and right groin    Current Outpatient Prescriptions  Medication Sig Dispense Refill  . losartan (COZAAR) 100 MG tablet Take 50 mg by mouth daily.     . ranitidine (ZANTAC) 150 MG tablet Take 150 mg by mouth daily.     . hydrochlorothiazide (MICROZIDE) 12.5 MG capsule Take 1 capsule (12.5 mg total) by mouth daily. 30 capsule 5   No current facility-administered medications for this visit.     Allergies:   Amlodipine    Social History:  The patient  reports that he has never smoked. He has never used smokeless tobacco. He reports that he does not drink alcohol or use drugs.   Family History:  The patient's family history includes Cancer in his paternal grandmother; Heart attack in his maternal grandfather and paternal grandfather; Hypertension in his father, maternal grandfather, and paternal grandfather.    ROS:  Please see the history of present illness.  Otherwise, review of systems are positive for none.   All other systems are reviewed and negative.    PHYSICAL EXAM: VS:  BP 136/62   Pulse 76   Ht  (1.702 m)   Wt 94.3 kg (208 lb)   BMI 32.58 kg/m  , BMI Body mass index is 32.58 kg/m. GENERAL:  Well appearing.  No acute distress HEENT:  Pupils equal round and reactive, fundi not visualized, oral mucosa unremarkable NECK:  No jugular venous distention, waveform within normal limits, carotid upstroke brisk and symmetric, no bruits, no  thyromegaly LYMPHATICS:  No cervical adenopathy LUNGS:  Clear to auscultation bilaterally HEART:  RRR.  PMI not displaced or sustained,S1 and S2 within normal limits, no S3, no S4, no clicks, no rubs, no murmurs CHEST: No chest wall tenderness to palpation  ABD:  Flat, positive bowel sounds normal in frequency in pitch, no bruits, no rebound, no guarding, no midline pulsatile mass, no hepatomegaly, no splenomegaly EXT:  2 plus pulses throughout, no edema, no cyanosis no clubbing SKIN:  No  rashes no nodules NEURO:  Cranial nerves II through XII grossly intact, motor grossly intact throughout PSYCH:  Cognitively intact, oriented to person place and time  EKG:  EKG is not ordered today. The ekg ordered 04/20/16 demonstrates sinus rhythm. 80 bpm. Cannot rule out prior inferior MI.  ETT 04/30/16:   Blood pressure demonstrated a hypertensive response to exercise.  There was no ST segment deviation noted during stress.  Normal exercise tolerance test at execellent work of 13.7 mets and adequate heart rate response.  Lowr risk study.   Recent Labs: 12/24/2016: ALT 157 12/25/2016: Hemoglobin 13.2; Platelets 258 01/28/2017: BUN 13; Creat 1.08; Potassium 4.1; Sodium 139   Lipid Panel    Component Value Date/Time   CHOL 158 10/07/2016 0941   TRIG 129 10/07/2016 0941   HDL 35 (L) 10/07/2016 0941   CHOLHDL 4.5 10/07/2016 0941   VLDL 26 10/07/2016 0941   LDLCALC 97 10/07/2016 0941    07/27/16: Total cholesterol 242, triglycerides 161, HDL 39, LDL 171.   Wt Readings from Last 3 Encounters:  04/11/17 94.3 kg (208 lb)  01/28/17 93.4 kg (206 lb)  12/31/16 93.3 kg (205 lb 9.6 oz)      ASSESSMENT AND PLAN:  # Hypertension: BP remains above goal and he is complaining of lower extremity edema. We will stop amlodipine and start either chlorothiazide 1.5 mg daily. Continue losartan 50 mg daily. We will repeat a basic metabolic panel and follow-up.  # Chest pain: CraigPowell complains of recurrent chest pain. He had an ETT 04/2016 that was negative for ischemia. Given his family history we will obtain a cardiac CT-A to better assess for coronary artery disease.  # Hyperlipidemia: Cholesterol levels were much better-controlled on pravastatin.  However, he developed severe rhabdomyolysis with exercise.  He is not willing to retry a statin.  Check lipids at follow up.     Current medicines are reviewed at length with the patient today.  The patient does not have concerns regarding  medicines.  The following changes have been made:  Start HCTZ 12.5 mg daily.  Labs/ tests ordered today include:   Orders Placed This Encounter  Procedures  . CT CORONARY MORPH W/CTA COR W/SCORE W/CA W/CM &/OR WO/CM  . CT CORONARY FRACTIONAL FLOW RESERVE DATA PREP  . CT CORONARY FRACTIONAL FLOW RESERVE FLUID ANALYSIS  . CBC with Differential/Platelet  . Basic metabolic panel     Disposition:   FU with Craig Gladson C. Duke Salvia, Craig Powell, Craig Powell in 1 month.  This note was written with the assistance of speech recognition software.  Please excuse any transcriptional errors.  Signed, Craig Powell C. Duke Salvia, Craig Powell, [facility]  04/11/2017 4:25 PM    [facility]

## 2017-04-11 NOTE — Patient Instructions (Addendum)
Medication Instructions:  STOP AMLODIPINE  START HYDROCHLOROTHIAZIDE 12.5 MG DAILY   Labwork: BMET/CBC SOON   Testing/Procedures: CARDIAC CTA  Follow-Up: Your physician recommends that you schedule a follow-up appointment in: 3-4 WEEKS   If you need a refill on your cardiac medications before your next appointment, please call your pharmacy.

## 2017-04-12 DIAGNOSIS — R0981 Nasal congestion: Secondary | ICD-10-CM | POA: Diagnosis not present

## 2017-04-12 DIAGNOSIS — G4452 New daily persistent headache (NDPH): Secondary | ICD-10-CM | POA: Diagnosis not present

## 2017-04-12 DIAGNOSIS — J0100 Acute maxillary sinusitis, unspecified: Secondary | ICD-10-CM | POA: Diagnosis not present

## 2017-04-12 DIAGNOSIS — J301 Allergic rhinitis due to pollen: Secondary | ICD-10-CM | POA: Diagnosis not present

## 2017-04-12 DIAGNOSIS — J0120 Acute ethmoidal sinusitis, unspecified: Secondary | ICD-10-CM | POA: Diagnosis not present

## 2017-04-12 DIAGNOSIS — J329 Chronic sinusitis, unspecified: Secondary | ICD-10-CM | POA: Diagnosis not present

## 2017-04-15 ENCOUNTER — Other Ambulatory Visit: Payer: Commercial Managed Care - HMO

## 2017-04-15 ENCOUNTER — Encounter: Payer: Self-pay | Admitting: Cardiovascular Disease

## 2017-04-18 ENCOUNTER — Other Ambulatory Visit (INDEPENDENT_AMBULATORY_CARE_PROVIDER_SITE_OTHER): Payer: Commercial Managed Care - HMO

## 2017-04-18 DIAGNOSIS — E7800 Pure hypercholesterolemia, unspecified: Secondary | ICD-10-CM

## 2017-04-18 DIAGNOSIS — I10 Essential (primary) hypertension: Secondary | ICD-10-CM | POA: Diagnosis not present

## 2017-04-18 LAB — CBC WITH DIFFERENTIAL/PLATELET
BASOS ABS: 0 10*3/uL (ref 0.0–0.2)
Basos: 0 %
EOS (ABSOLUTE): 0.3 10*3/uL (ref 0.0–0.4)
Eos: 2 %
HEMATOCRIT: 43.1 % (ref 37.5–51.0)
Hemoglobin: 14.6 g/dL (ref 13.0–17.7)
IMMATURE GRANS (ABS): 0 10*3/uL (ref 0.0–0.1)
Immature Granulocytes: 0 %
LYMPHS ABS: 2.2 10*3/uL (ref 0.7–3.1)
Lymphs: 19 %
MCH: 28 pg (ref 26.6–33.0)
MCHC: 33.9 g/dL (ref 31.5–35.7)
MCV: 83 fL (ref 79–97)
Monocytes Absolute: 0.7 10*3/uL (ref 0.1–0.9)
Monocytes: 6 %
NEUTROS ABS: 8.4 10*3/uL — AB (ref 1.4–7.0)
Neutrophils: 73 %
Platelets: 322 10*3/uL (ref 150–379)
RBC: 5.21 x10E6/uL (ref 4.14–5.80)
RDW: 12.4 % (ref 12.3–15.4)
WBC: 11.6 10*3/uL — ABNORMAL HIGH (ref 3.4–10.8)

## 2017-04-18 LAB — BASIC METABOLIC PANEL
BUN/Creatinine Ratio: 13 (ref 9–20)
BUN: 14 mg/dL (ref 6–20)
CO2: 26 mmol/L (ref 18–29)
Calcium: 9.9 mg/dL (ref 8.7–10.2)
Chloride: 100 mmol/L (ref 96–106)
Creatinine, Ser: 1.12 mg/dL (ref 0.76–1.27)
GFR calc Af Amer: 99 mL/min/{1.73_m2} (ref >59)
GFR calc non Af Amer: 86 mL/min/{1.73_m2} (ref >59)
Glucose: 103 mg/dL — ABNORMAL HIGH (ref 65–99)
Potassium: 4.7 mmol/L (ref 3.5–5.2)
Sodium: 140 mmol/L (ref 134–144)

## 2017-04-18 LAB — LIPID PANEL
Chol/HDL Ratio: 5.8 ratio — ABNORMAL HIGH (ref 0.0–5.0)
Cholesterol, Total: 209 mg/dL — ABNORMAL HIGH (ref 100–199)
HDL: 36 mg/dL — ABNORMAL LOW (ref >39)
LDL Calculated: 144 mg/dL — ABNORMAL HIGH (ref 0–99)
Triglycerides: 147 mg/dL (ref 0–149)
VLDL Cholesterol Cal: 29 mg/dL (ref 5–40)

## 2017-04-19 DIAGNOSIS — Z1159 Encounter for screening for other viral diseases: Secondary | ICD-10-CM | POA: Diagnosis not present

## 2017-04-22 ENCOUNTER — Ambulatory Visit (HOSPITAL_COMMUNITY)
Admission: RE | Admit: 2017-04-22 | Discharge: 2017-04-22 | Disposition: A | Discharge status: Home / Self Care | Payer: Commercial Managed Care - HMO | Source: Ambulatory Visit | Attending: Cardiovascular Disease | Admitting: Cardiovascular Disease

## 2017-04-22 ENCOUNTER — Ambulatory Visit (HOSPITAL_COMMUNITY): Payer: Commercial Managed Care - HMO

## 2017-04-22 DIAGNOSIS — R079 Chest pain, unspecified: Secondary | ICD-10-CM

## 2017-04-22 MED ORDER — METOPROLOL TARTRATE 5 MG/5ML IV SOLN
INTRAVENOUS | Status: AC
  Filled 2017-04-22: qty 5

## 2017-04-22 MED ORDER — IOPAMIDOL (ISOVUE-370) INJECTION 76%
INTRAVENOUS | Status: AC
  Administered 2017-04-22: 100 mL
  Filled 2017-04-22: qty 100

## 2017-04-22 MED ORDER — NITROGLYCERIN 0.4 MG SL SUBL
0.8000 mg | SUBLINGUAL_TABLET | Freq: Once | SUBLINGUAL | Status: AC
  Administered 2017-04-22: 0.4 mg via SUBLINGUAL
  Filled 2017-04-22: qty 25

## 2017-04-22 MED ORDER — NITROGLYCERIN 0.4 MG SL SUBL
SUBLINGUAL_TABLET | SUBLINGUAL | Status: AC
  Filled 2017-04-22: qty 2

## 2017-04-22 MED ORDER — METOPROLOL TARTRATE 5 MG/5ML IV SOLN
5.0000 mg | Freq: Once | INTRAVENOUS | Status: AC
  Administered 2017-04-22: 5 mg via INTRAVENOUS
  Filled 2017-04-22: qty 5

## 2017-04-25 ENCOUNTER — Encounter: Payer: Self-pay | Admitting: Cardiovascular Disease

## 2017-05-06 ENCOUNTER — Ambulatory Visit (INDEPENDENT_AMBULATORY_CARE_PROVIDER_SITE_OTHER): Payer: Commercial Managed Care - HMO | Admitting: Cardiovascular Disease

## 2017-05-06 ENCOUNTER — Encounter: Payer: Self-pay | Admitting: Cardiovascular Disease

## 2017-05-06 VITALS — BP 133/85 | HR 68 | Ht 67.0 in | Wt 203.0 lb

## 2017-05-06 DIAGNOSIS — E66811 Obesity, class 1: Secondary | ICD-10-CM

## 2017-05-06 DIAGNOSIS — E669 Obesity, unspecified: Secondary | ICD-10-CM | POA: Diagnosis not present

## 2017-05-06 DIAGNOSIS — I10 Essential (primary) hypertension: Secondary | ICD-10-CM

## 2017-05-06 DIAGNOSIS — E7800 Pure hypercholesterolemia, unspecified: Secondary | ICD-10-CM | POA: Diagnosis not present

## 2017-05-06 DIAGNOSIS — Z79899 Other long term (current) drug therapy: Secondary | ICD-10-CM

## 2017-05-06 NOTE — Patient Instructions (Signed)
Medication Instructions:  INCREASE YOUR LOSARTAN TO 100 MG DAILY  Labwork: BMET AT [facility] IN 1 WEEK  Testing/Procedures: NONE  Follow-Up: Your physician wants you to follow-up in: 6 MONTH OV You will receive a reminder letter in the mail two months in advance. If you don't receive a letter, please call our office to schedule the follow-up appointment.  If you need a refill on your cardiac medications before your next appointment, please call your pharmacy.

## 2017-05-06 NOTE — Progress Notes (Signed)
Cardiology Office Note   Date:  05/06/2017   ID:  Craig Powell, DOB 06-02-1981, MRN [id number]  PCP:  Lonie Peak, PA-C  Cardiologist:   Chilton Si, MD   Chief Complaint  Patient presents with  . 3 week f/u visit    pt states f/u from CT scan     History of Present Illness: Craig Powell is a 36 y.o. male with hypertension and hyperlipidemia who presents for follow-up. He reports borderline hypertension since he was in college. He started on losartan 04/20/16 and the dose was increased 07/2016. He was also started on pravastatin due to hyperlipidemia his concern about statins worsening his pre-diabetes.  [name] reported atypical chest was and was referred for ETT on 04/30/16 that was negative for ischemia.  He developed rhabdomyolysis with a CPK greater than 50,000. He was hospitalized and had cute renal insufficiency. His creatinine improved to 1 at the time discharge. His blood pressures remained elevated so amlodipine was added to his regimen.  However this was stopped at his last appointment due to edema.  He was also referred for a cardiac CT-A/2018. This revealed a coronary artery calcification score of 0. At his last appointment amlodipine was discontinued and hydrochlorothiazide was added to his regimen. His blood pressure has been ranging in the 130s to 140s over 80s. He has not started exercising but is trying to eat healthier. He mostly eats fruit for breakfast. He no longer has lower extremity edema and he denies chest pain or shortness of breath.   Past Medical History:  Diagnosis Date  . Essential hypertension 04/22/2016  . Hyperlipidemia   . Hypertension   . Obesity (BMI 30.0-34.9) 04/22/2016    Past Surgical History:  Procedure Laterality Date  . HERNIA REPAIR     umbilical, and right groin    Current Outpatient Prescriptions  Medication Sig Dispense Refill  . hydrochlorothiazide (MICROZIDE) 12.5 MG capsule Take 1 capsule (12.5 mg total) by mouth  daily. 30 capsule 5  . losartan (COZAAR) 100 MG tablet Take 100 mg by mouth daily.     . ranitidine (ZANTAC) 150 MG tablet Take 150 mg by mouth daily.      No current facility-administered medications for this visit.     Allergies:   Pravastatin and Amlodipine    Social History:  The patient  reports that he has never smoked. He has never used smokeless tobacco. He reports that he does not drink alcohol or use drugs.   Family History:  The patient's family history includes Cancer in his paternal grandmother; Heart attack in his maternal grandfather and paternal grandfather; Hypertension in his father, maternal grandfather, and paternal grandfather.    ROS:  Please see the history of present illness.  Otherwise, review of systems are positive for sinus congestion, headaches.   All other systems are reviewed and negative.    PHYSICAL EXAM: VS:  BP 133/85 (BP Location: Right Arm, Patient Position: Sitting, Cuff Size: Normal)   Pulse 68   Ht 5\' 7"  (1.702 m)   Wt 92.1 kg (203 lb)   BMI 31.79 kg/m  , BMI Body mass index is 31.79 kg/m. GENERAL:  Well appearing.  No acute distress HEENT:  Pupils equal round and reactive, fundi not visualized, oral mucosa unremarkable NECK:  No jugular venous distention, waveform within normal limits, carotid upstroke brisk and symmetric, no bruits LYMPHATICS:  Bilateral axillary lymphadenopathy LUNGS:  Clear to auscultation bilaterally.  No crackles, or rhonchi. Sugar) when stronger like  her temperature daily beer because of allergies.  HEART:  RRR.  PMI not displaced or sustained,S1 and S2 within normal limits, no S3, no S4, no clicks, no rubs, no murmurs ABD:  Flat, positive bowel sounds normal in frequency in pitch, no bruits, no rebound, no guarding, no midline pulsatile mass, no hepatomegaly, no splenomegaly EXT:  2 plus pulses throughout, no edema, no cyanosis no clubbing SKIN:  No rashes no nodules NEURO:  Cranial nerves II through XII grossly  intact, motor grossly intact throughout PSYCH:  Cognitively intact, oriented to person place and time  EKG:  EKG is not ordered today. The ekg ordered 04/20/16 demonstrates sinus rhythm. 80 bpm. Cannot rule out prior inferior MI.  ETT 04/30/16:   Blood pressure demonstrated a hypertensive response to exercise.  There was no ST segment deviation noted during stress.  Normal exercise tolerance test at execellent work of 13.7 mets and adequate heart rate response.  Lowr risk study.   Recent Labs: 12/24/2016: ALT 157 12/25/2016: Hemoglobin 13.2 04/18/2017: BUN 14; Creatinine, Ser 1.12; Platelets 322; Potassium 4.7; Sodium 140   Lipid Panel    Component Value Date/Time   CHOL 209 (H) 04/18/2017 1100   TRIG 147 04/18/2017 1100   HDL 36 (L) 04/18/2017 1100   CHOLHDL 5.8 (H) 04/18/2017 1100   CHOLHDL 4.5 10/07/2016 0941   VLDL 26 10/07/2016 0941   LDLCALC 144 (H) 04/18/2017 1100    07/27/16: Total cholesterol 242, triglycerides 161, HDL 39, LDL 171.   Wt Readings from Last 3 Encounters:  05/06/17 92.1 kg (203 lb)  04/11/17 94.3 kg (208 lb)  01/28/17 93.4 kg (206 lb)      ASSESSMENT AND PLAN:  # Hypertension: BP is better but still >130/80.   increase losartan to 100 mg daily. Continue hydrochlorothiazide 12.5 mg daily. Repeat basic metabolic panel in one week.   # Chest pain: Not cardiac.  No plaque was noted on cardiac CT-A 03/2017.  # Hyperlipidemia: Cholesterol levels were much better-controlled on pravastatin.  However, he developed severe rhabdomyolysis with exercise.  ASCVD 10 year risk is 2.5% and no plaque seen on cardiac CT-A.  He will continue to work on diet and increase exercise.     Current medicines are reviewed at length with the patient today.  The patient does not have concerns regarding medicines.  The following changes have been made: Increase losartan to 100 mg daily.   Labs/ tests ordered today include:   Orders Placed This Encounter  Procedures  .  Basic metabolic panel     Disposition:   FU with Michiah Mudry C. Duke Salviaandolph, MD, [facility] in 6 months.  This note was written with the assistance of speech recognition software.  Please excuse any transcriptional errors.  Signed, Trevonne Nyland C. Duke Salviaandolph, MD, [facility]  05/06/2017 4:13 PM    [facility]

## 2017-05-20 DIAGNOSIS — Z79899 Other long term (current) drug therapy: Secondary | ICD-10-CM | POA: Diagnosis not present

## 2017-05-21 LAB — BASIC METABOLIC PANEL
BUN/Creatinine Ratio: 13 (ref 9–20)
BUN: 16 mg/dL (ref 6–20)
CALCIUM: 9.9 mg/dL (ref 8.7–10.2)
CO2: 25 mmol/L (ref 18–29)
Chloride: 98 mmol/L (ref 96–106)
Creatinine, Ser: 1.21 mg/dL (ref 0.76–1.27)
GFR, EST AFRICAN AMERICAN: 90 mL/min/{1.73_m2} (ref >59)
GFR, EST NON AFRICAN AMERICAN: 78 mL/min/{1.73_m2} (ref >59)
Glucose: 106 mg/dL — ABNORMAL HIGH (ref 65–99)
POTASSIUM: 4.6 mmol/L (ref 3.5–5.2)
Sodium: 140 mmol/L (ref 134–144)

## 2017-06-07 DIAGNOSIS — J324 Chronic pansinusitis: Secondary | ICD-10-CM | POA: Diagnosis not present

## 2017-07-15 DIAGNOSIS — H60501 Unspecified acute noninfective otitis externa, right ear: Secondary | ICD-10-CM | POA: Diagnosis not present

## 2017-08-27 ENCOUNTER — Other Ambulatory Visit: Payer: Self-pay | Admitting: Cardiovascular Disease

## 2017-08-30 NOTE — Telephone Encounter (Signed)
Refill Request.  

## 2017-08-30 NOTE — Telephone Encounter (Signed)
REFILL 

## 2017-08-30 NOTE — Telephone Encounter (Signed)
Please review for refill, thanks ! 

## 2017-10-13 ENCOUNTER — Other Ambulatory Visit: Payer: Self-pay | Admitting: Cardiovascular Disease

## 2017-10-13 NOTE — Telephone Encounter (Signed)
REFILL 

## 2017-10-13 NOTE — Telephone Encounter (Signed)
Please review for refill, thanks ! 

## 2017-11-24 ENCOUNTER — Encounter: Payer: Self-pay | Admitting: Cardiovascular Disease

## 2017-11-24 ENCOUNTER — Ambulatory Visit: Payer: 59 | Admitting: Cardiovascular Disease

## 2017-11-24 VITALS — BP 126/81 | HR 66 | Ht 67.0 in | Wt 212.6 lb

## 2017-11-24 DIAGNOSIS — Z5181 Encounter for therapeutic drug level monitoring: Secondary | ICD-10-CM | POA: Diagnosis not present

## 2017-11-24 DIAGNOSIS — K219 Gastro-esophageal reflux disease without esophagitis: Secondary | ICD-10-CM | POA: Diagnosis not present

## 2017-11-24 DIAGNOSIS — E7800 Pure hypercholesterolemia, unspecified: Secondary | ICD-10-CM | POA: Diagnosis not present

## 2017-11-24 DIAGNOSIS — I10 Essential (primary) hypertension: Secondary | ICD-10-CM | POA: Diagnosis not present

## 2017-11-24 DIAGNOSIS — R591 Generalized enlarged lymph nodes: Secondary | ICD-10-CM | POA: Diagnosis not present

## 2017-11-24 HISTORY — DX: Gastro-esophageal reflux disease without esophagitis: K21.9

## 2017-11-24 LAB — BASIC METABOLIC PANEL
BUN/Creatinine Ratio: 11 (ref 9–20)
BUN: 12 mg/dL (ref 6–20)
CO2: 22 mmol/L (ref 20–29)
CREATININE: 1.1 mg/dL (ref 0.76–1.27)
Calcium: 9.7 mg/dL (ref 8.7–10.2)
Chloride: 100 mmol/L (ref 96–106)
GFR calc Af Amer: 101 mL/min/{1.73_m2} (ref >59)
GFR, EST NON AFRICAN AMERICAN: 87 mL/min/{1.73_m2} (ref >59)
Glucose: 102 mg/dL — ABNORMAL HIGH (ref 65–99)
Potassium: 4.5 mmol/L (ref 3.5–5.2)
SODIUM: 140 mmol/L (ref 134–144)

## 2017-11-24 LAB — CBC WITH DIFFERENTIAL/PLATELET
Basophils Absolute: 0.1 10*3/uL (ref 0.0–0.2)
Basos: 1 %
EOS (ABSOLUTE): 0.3 10*3/uL (ref 0.0–0.4)
EOS: 3 %
HEMATOCRIT: 45.2 % (ref 37.5–51.0)
HEMOGLOBIN: 15 g/dL (ref 13.0–17.7)
Immature Grans (Abs): 0 10*3/uL (ref 0.0–0.1)
Immature Granulocytes: 0 %
LYMPHS ABS: 1.6 10*3/uL (ref 0.7–3.1)
LYMPHS: 17 %
MCH: 27.9 pg (ref 26.6–33.0)
MCHC: 33.2 g/dL (ref 31.5–35.7)
MCV: 84 fL (ref 79–97)
MONOCYTES: 8 %
Monocytes Absolute: 0.7 10*3/uL (ref 0.1–0.9)
NEUTROS ABS: 6.9 10*3/uL (ref 1.4–7.0)
Neutrophils: 71 %
Platelets: 297 10*3/uL (ref 150–379)
RBC: 5.37 x10E6/uL (ref 4.14–5.80)
RDW: 14.1 % (ref 12.3–15.4)
WBC: 9.5 10*3/uL (ref 3.4–10.8)

## 2017-11-24 LAB — SEDIMENTATION RATE: Sed Rate: 6 mm/hr (ref 0–15)

## 2017-11-24 MED ORDER — OMEPRAZOLE 40 MG PO CPDR: 40.0000 mg | DELAYED_RELEASE_CAPSULE | Freq: Every day | ORAL | 3 refills | Status: DC

## 2017-11-24 NOTE — Progress Notes (Signed)
Cardiology Office Note   Date:  11/24/2017   ID:  Anh Mangano, DOB 1981/02/08, MRN [id number]  PCP:  Cyndi Bender, PA-C  Cardiologist:   Skeet Latch, MD   No chief complaint on file.    History of Present Illness: Craig Powell is a 36 y.o. male with hypertension and hyperlipidemia who presents for follow-up. He reports borderline hypertension since he was in college. He started on losartan 04/20/16 and the dose was increased 07/2016. He was also started on pravastatin due to hyperlipidemia his concern about statins worsening his pre-diabetes.  Craig Powell reported atypical chest was and was referred for ETT on 04/30/16 that was negative for ischemia.  He developed rhabdomyolysis with a CPK greater than 50,000. He was hospitalized and had cute renal insufficiency. His creatinine improved to 1 at the time discharge. His blood pressures remained elevated so amlodipine was added to his regimen.  However this was stopped due to edema.  HCTZ was added and losartan was increased at his last appointment.   Since his last appointment Craig Powell has been doing well.  His only complaint is that he continues to get painful spots under his armpits approximately once every 3 weeks.  The are very tender to touch and approximately the size of the tip of his finger.  He denies fever or chills.  He has tried stopping and switching deodorants without any change in the symptoms.  He saw his PCP who thought it may be MRSA.  However it has never ruptured or drained.  It always goes away on its own within a few days.  The last episode was one week ago.  He continues to be very busy at work.  He notes that in the morning his blood pressure is typically well controlled.  However when he checks it at work sometimes it is in the 140s over 80s-90s.  He tries to exercise at least twice per week but is afraid to push himself too much.  He thinks that he may developed rhabdomyolysis again.  His weight is slowly  increasing, but he thinks he is only up 5 pounds based on his scales at home.  He is wearing steel toe boots today and thinks that this is contributing to his extra weight gain.  He continues to have intermittent episodes of chest pain.  This typically occurs when sitting or lying down.  In the past he was on both omeprazole and Zantac but tried to stop the omeprazole.  He notes that his acid reflux has been worse lately.  He has no exertional chest pain.  He does sometimes get short of breath with exertion and his wife notes that he sometimes seems to be breathing heavily even when seated.   Past Medical History:  Diagnosis Date  . Essential hypertension 04/22/2016  . GERD (gastroesophageal reflux disease) 11/24/2017  . Hyperlipidemia   . Hypertension   . Obesity (BMI 30.0-34.9) 04/22/2016    Past Surgical History:  Procedure Laterality Date  . HERNIA REPAIR     umbilical, and right groin    Current Outpatient Medications  Medication Sig Dispense Refill  . hydrochlorothiazide (MICROZIDE) 12.5 MG capsule TAKE ONE CAPSULE BY MOUTH EVERY DAY 30 capsule 5  . losartan (COZAAR) 100 MG tablet TAKE 1 TABLET (100 MG TOTAL) BY MOUTH DAILY. 90 tablet 2  . omeprazole (PRILOSEC) 40 MG capsule Take 1 capsule (40 mg total) by mouth daily. 90 capsule 3   No current facility-administered medications for this  visit.     Allergies:   Pravastatin and Amlodipine    Social History:  The patient  reports that  has never smoked. he has never used smokeless tobacco. He reports that he does not drink alcohol or use drugs.   Family History:  The patient's family history includes Cancer in his paternal grandmother; Heart attack in his maternal grandfather and paternal grandfather; Hypertension in his father, maternal grandfather, and paternal grandfather.    ROS:  Please see the history of present illness.  Otherwise, review of systems are positive for none.   All other systems are reviewed and negative.     PHYSICAL EXAM: VS:  BP 126/81   Pulse 66   Ht '5\' 7"'  (1.702 m)   Wt 212 lb 9.6 oz (96.4 kg)   BMI 33.30 kg/m  , BMI Body mass index is 33.3 kg/m. GENERAL:  Well appearing HEENT: Pupils equal round and reactive, fundi not visualized, oral mucosa unremarkable NECK:  No jugular venous distention, waveform within normal limits, carotid upstroke brisk and symmetric, no bruits, no thyromegaly LYMPHATICS:  No cervical or axillary adenopathy LUNGS:  Clear to auscultation bilaterally HEART:  RRR.  PMI not displaced or sustained,S1 and S2 within normal limits, no S3, no S4, no clicks, no rubs, no murmurs ABD:  Flat, positive bowel sounds normal in frequency in pitch, no bruits, no rebound, no guarding, no midline pulsatile mass, no hepatomegaly, no splenomegaly EXT:  2 plus pulses throughout, no edema, no cyanosis no clubbing SKIN:  No rashes no nodules NEURO:  Cranial nerves II through XII grossly intact, motor grossly intact throughout PSYCH:  Cognitively intact, oriented to person place and time  EKG:  EKG is ordered today. The ekg ordered 04/20/16 demonstrates sinus rhythm. 80 bpm. Cannot rule out prior inferior MI. 11/24/17: Sinus rhythm.  Rate 66 bpm.  Keep taking it for a few days  ETT 04/30/16:   Blood pressure demonstrated a hypertensive response to exercise.  There was no ST segment deviation noted during stress.  Normal exercise tolerance test at execellent work of 13.7 mets and adequate heart rate response.  Lowr risk study.   Recent Labs: 12/24/2016: ALT 157 04/18/2017: Hemoglobin 14.6; Platelets 322 05/20/2017: BUN 16; Creatinine, Ser 1.21; Potassium 4.6; Sodium 140   Lipid Panel    Component Value Date/Time   CHOL 209 (H) 04/18/2017 1100   TRIG 147 04/18/2017 1100   HDL 36 (L) 04/18/2017 1100   CHOLHDL 5.8 (H) 04/18/2017 1100   CHOLHDL 4.5 10/07/2016 0941   VLDL 26 10/07/2016 0941   LDLCALC 144 (H) 04/18/2017 1100    07/27/16: Total cholesterol 242, triglycerides  161, HDL 39, LDL 171.   Wt Readings from Last 3 Encounters:  11/24/17 212 lb 9.6 oz (96.4 kg)  05/06/17 203 lb (92.1 kg)  04/11/17 208 lb (94.3 kg)      ASSESSMENT AND PLAN:  # Hypertension: BP is better controlled but sometimes high at work.  He will check in the evening when he gets home and stable and let us know if it is >130/80.  Continue HCTZ and losartan.  Check BMP.  # Axillary adenopathy: Unclear what is causing this or if it is lymph nodes.  There are no fever or chills.  No weight loss.  Check CBC and ESR.    # Chest pain: Not cardiac.  No plaque was noted on cardiac CT-A 03/2017.  I suspect this is GERD.  We will start omeprazole 97m daily.  Continue  Zantac for 3 days then prn. Also encouraged weight loss.   # Hyperlipidemia: Cholesterol levels were much better-controlled on pravastatin.  However, he developed severe rhabdomyolysis with exercise.  ASCVD 10 year risk is 2.5% and no plaque seen on cardiac CT-A.  He will continue to work on diet and increase exercise.     Current medicines are reviewed at length with the patient today.  The patient does not have concerns regarding medicines.  The following changes have been made: Increase losartan to 100 mg daily.   Labs/ tests ordered today include:   Orders Placed This Encounter  Procedures  . Sed Rate (ESR)  . CBC with Differential/Platelet  . Basic metabolic panel     Disposition:   FU with Geena Weinhold C. Oval Linsey, MD, [facility] in 1 year.   This note was written with the assistance of speech recognition software.  Please excuse any transcriptional errors.  Signed, Ahliyah Nienow C. Oval Linsey, MD, [facility]  11/24/2017 8:50 AM    [facility]

## 2017-11-24 NOTE — Addendum Note (Signed)
Addended by: Regis BillPRATT, Sansa Alkema B on: 11/24/2017 05:04 PM   Modules accepted: Orders

## 2017-11-24 NOTE — Patient Instructions (Addendum)
Medication Instructions:  CONTINUE TO TAKE ZANTAC FOR A FEW DAYS AND THEN STOP   START OMEPRAZOLE 40 MG DAILY   Labwork: BMET/CBC/SED RATE TODAY   Testing/Procedures: NONE  Follow-Up: Your physician wants you to follow-up in: 1 YEAR OV  You will receive a reminder letter in the mail two months in advance. If you don't receive a letter, please call our office to schedule the follow-up appointment.  MONITOR YOUR BLOOD PRESSURE IN THE EVENING

## 2017-12-07 ENCOUNTER — Other Ambulatory Visit: Payer: Self-pay | Admitting: *Deleted

## 2017-12-07 MED ORDER — LOSARTAN POTASSIUM 100 MG PO TABS: 100.0000 mg | ORAL_TABLET | Freq: Every day | ORAL | 3 refills | Status: DC

## 2018-03-26 ENCOUNTER — Other Ambulatory Visit: Payer: Self-pay | Admitting: Cardiovascular Disease

## 2018-03-27 NOTE — Telephone Encounter (Signed)
Rx(s) sent to pharmacy electronically.  

## 2018-03-27 NOTE — Telephone Encounter (Signed)
Refill Request.  

## 2018-11-29 ENCOUNTER — Encounter: Payer: Self-pay | Admitting: Cardiovascular Disease

## 2018-11-29 ENCOUNTER — Ambulatory Visit: Payer: 59 | Admitting: Cardiovascular Disease

## 2018-11-29 VITALS — BP 116/70 | HR 67 | Ht 67.0 in | Wt 213.8 lb

## 2018-11-29 DIAGNOSIS — E7800 Pure hypercholesterolemia, unspecified: Secondary | ICD-10-CM

## 2018-11-29 DIAGNOSIS — I10 Essential (primary) hypertension: Secondary | ICD-10-CM

## 2018-11-29 DIAGNOSIS — Z5181 Encounter for therapeutic drug level monitoring: Secondary | ICD-10-CM | POA: Diagnosis not present

## 2018-11-29 MED ORDER — EZETIMIBE 10 MG PO TABS: 10.0000 mg | ORAL_TABLET | Freq: Every day | ORAL | 3 refills | Status: DC

## 2018-11-29 NOTE — Patient Instructions (Signed)
Medication Instructions:  START ZETIA 10 MG DAILY   If you need a refill on your cardiac medications before your next appointment, please call your pharmacy.   Lab work: FASTING LP/CMET IN 2 MONTHS  If you have labs (blood work) drawn today and your tests are completely normal, you will receive your results only by: Marland Kitchen. MyChart Message (if you have MyChart) OR . A paper copy in the mail If you have any lab test that is abnormal or we need to change your treatment, we will call you to review the results.  Testing/Procedures: NONE  Follow-Up: At [facility], you and your health needs are our priority.  As part of our continuing mission to provide you with exceptional heart care, we have created designated Provider Care Teams.  These Care Teams include your primary Cardiologist (physician) and Advanced Practice Providers (APPs -  Physician Assistants and Nurse Practitioners) who all work together to provide you with the care you need, when you need it. You will need a follow up appointment in 12 months.  Please call our office 2 months in advance to schedule this appointment.  You may see DR [facility]  or one of the following Advanced Practice Providers on your designated Care Team:   Corine ShelterLuke Kilroy, PA-C Judy PimpleKrista Kroeger, [place] JerseyPA-C . Marjie Skiffallie Goodrich, PA-C

## 2018-11-29 NOTE — Progress Notes (Signed)
Cardiology Office Note   Date:  11/29/2018   ID:  Craig Powell, DOB May 13, 1981, MRN [id number]  PCP:  Lonie Peakonroy, Nathan, PA-C  Cardiologist:   Chilton Siiffany Jerauld, MD   No chief complaint on file.    History of Present Illness: Craig Powell is a 37 y.o. male with hypertension and hyperlipidemia who presents for follow-up. He reports borderline hypertension since he was in college. He started on losartan 04/20/16 and the dose was increased 07/2016. He was also started on pravastatin due to hyperlipidemia his concern about statins worsening his pre-diabetes.  [name] reported atypical chest was and was referred for ETT on 04/30/16 that was negative for ischemia.  He developed rhabdomyolysis with a CPK greater than 50,000. He was hospitalized and had cute renal insufficiency. His creatinine improved to 1 at the time discharge. His blood pressures remained elevated so amlodipine was added to his regimen.  However this was stopped due to edema.  HCTZ was added and losartan was increased.  At his last appointment [name] had atypical chest pain that was thought to be GERD.  He was started on omeprazole.  He no longer has chest pain.  He has some mild lightheadedness but denies syncope.  His blood pressure has been well-controlled at home.  It is usually 120/80 or less.  It is rarely in the 130.  Overall he has been feeling well.  He has occasional mild lower extremity edema that improves with elevation of his legs.  He is on his legs a lot at work and typically wears compression socks.  His diet has been okay.  His wife is pregnant and he has been giving into her cravings as well.  He has no chest pain and his breathing is stable.  He denies orthopnea or PND.   Past Medical History:  Diagnosis Date  . Essential hypertension 04/22/2016  . GERD (gastroesophageal reflux disease) 11/24/2017  . Hyperlipidemia   . Hypertension   . Obesity (BMI 30.0-34.9) 04/22/2016    Past Surgical History:    Procedure Laterality Date  . HERNIA REPAIR     umbilical, and right groin    Current Outpatient Medications  Medication Sig Dispense Refill  . hydrochlorothiazide (MICROZIDE) 12.5 MG capsule TAKE ONE CAPSULE BY MOUTH EVERY DAY 30 capsule 8  . losartan (COZAAR) 100 MG tablet Take 1 tablet (100 mg total) by mouth daily. 90 tablet 3  . omeprazole (PRILOSEC) 40 MG capsule Take 1 capsule (40 mg total) by mouth daily. 90 capsule 3  . ezetimibe (ZETIA) 10 MG tablet Take 1 tablet (10 mg total) by mouth daily. 90 tablet 3   No current facility-administered medications for this visit.     Allergies:   Pravastatin and Amlodipine    Social History:  The patient  reports that he has never smoked. He has never used smokeless tobacco. He reports that he does not drink alcohol or use drugs.   Family History:  The patient's family history includes Cancer in his paternal grandmother; Heart attack in his maternal grandfather and paternal grandfather; Hypertension in his father, maternal grandfather, and paternal grandfather.    ROS:  Please see the history of present illness.  Otherwise, review of systems are positive for none.   All other systems are reviewed and negative.    PHYSICAL EXAM: VS:  BP 116/70   Pulse 67   Ht 5\' 7"  (1.702 m)   Wt 213 lb 12.8 oz (97 kg)   BMI 33.49 kg/m  ,  BMI Body mass index is 33.49 kg/m. GENERAL:  Well appearing HEENT: Pupils equal round and reactive, fundi not visualized, oral mucosa unremarkable NECK:  No jugular venous distention, waveform within normal limits, carotid upstroke brisk and symmetric, no bruits, no thyromegaly LYMPHATICS:  No cervical adenopathy LUNGS:  Clear to auscultation bilaterally HEART:  RRR.  PMI not displaced or sustained,S1 and S2 within normal limits, no S3, no S4, no clicks, no rubs, no murmurs ABD:  Flat, positive bowel sounds normal in frequency in pitch, no bruits, no rebound, no guarding, no midline pulsatile mass, no  hepatomegaly, no splenomegaly EXT:  2 plus pulses throughout, no edema, no cyanosis no clubbing SKIN:  No rashes no nodules NEURO:  Cranial nerves II through XII grossly intact, motor grossly intact throughout PSYCH:  Cognitively intact, oriented to person place and time Which is  EKG:  EKG is ordered today. The ekg ordered 04/20/16 demonstrates sinus rhythm. 80 bpm. Cannot rule out prior inferior MI. 11/24/17: Sinus rhythm.  Rate 66 bpm.  Keep taking it for a few days   ETT 04/30/16:   Blood pressure demonstrated a hypertensive response to exercise.  There was no ST segment deviation noted during stress.  Normal exercise tolerance test at execellent work of 13.7 mets and adequate heart rate response.  Lowr risk study.   Recent Labs: No results found for requested labs within last 8760 hours.   Lipid Panel    Component Value Date/Time   CHOL 209 (H) 04/18/2017 1100   TRIG 147 04/18/2017 1100   HDL 36 (L) 04/18/2017 1100   CHOLHDL 5.8 (H) 04/18/2017 1100   CHOLHDL 4.5 10/07/2016 0941   VLDL 26 10/07/2016 0941   LDLCALC 144 (H) 04/18/2017 1100    07/27/16: Total cholesterol 242, triglycerides 161, HDL 39, LDL 171.  09/26/2018: Total cholesterol 250, triglycerides 176, HDL 40, LDL 175  Wt Readings from Last 3 Encounters:  11/29/18 213 lb 12.8 oz (97 kg)  11/24/17 212 lb 9.6 oz (96.4 kg)  05/06/17 203 lb (92.1 kg)      ASSESSMENT AND PLAN:  # Hypertension: BP is well-controlled on HCTZ and losartan.   # Chest pain: Resolved with treatment of GERD.  # Hyperlipidemia: Cholesterol levels were much better-controlled on pravastatin.  However, he developed severe rhabdomyolysis with exercise.  ASCVD 10 year risk is 2.5% and no plaque seen on cardiac CT-A.  Levels remain elevated with a LDL of 175.  He will try Zetia 10 mg daily.  Repeat lipids and CMP in 2 months.   Current medicines are reviewed at length with the patient today.  The patient does not have concerns regarding  medicines.  The following changes have been made: Increase losartan to 100 mg daily.   Labs/ tests ordered today include:   Orders Placed This Encounter  Procedures  . Lipid panel  . Comprehensive metabolic panel  . EKG 12-Lead     Disposition:   FU with Kenyata Guess C. Duke Salvia, MD, [facility] in 1 year.    Signed, Fynley Chrystal C. Duke Salvia, MD, [facility]  11/29/2018 8:38 AM    [facility]

## 2018-12-09 ENCOUNTER — Other Ambulatory Visit: Payer: Self-pay | Admitting: Cardiovascular Disease

## 2018-12-10 ENCOUNTER — Other Ambulatory Visit: Payer: Self-pay | Admitting: Cardiovascular Disease

## 2019-01-12 ENCOUNTER — Other Ambulatory Visit: Payer: Self-pay | Admitting: Cardiovascular Disease

## 2019-01-31 ENCOUNTER — Telehealth: Payer: Self-pay

## 2019-01-31 NOTE — Telephone Encounter (Signed)
Called patient, regarding mychart message.  Will message to Dr.Keswick to make her aware. Will route to PharmD to have them see if medication related.  Will make patient aware to contact Primary Care doctor regarding throat, and throwing up blood.

## 2019-01-31 NOTE — Telephone Encounter (Signed)
  Patient is returning call from [place]

## 2019-01-31 NOTE — Telephone Encounter (Signed)
Called patient, advised that he should see PCP regarding the blood with throwing up.  But I had sent a message to PharmD and would call with any recommendations, since he has had issues with his medications before.  Patient verbalized understanding and would contact his PCP to make them aware of this issue as well.

## 2019-11-14 ENCOUNTER — Ambulatory Visit: Payer: 59 | Admitting: Cardiovascular Disease

## 2019-11-14 ENCOUNTER — Encounter: Payer: Self-pay | Admitting: Cardiovascular Disease

## 2019-11-14 ENCOUNTER — Other Ambulatory Visit: Payer: Self-pay

## 2019-11-14 VITALS — BP 126/82 | HR 63 | Temp 97.7°F | Ht 67.0 in | Wt 210.0 lb

## 2019-11-14 DIAGNOSIS — E7800 Pure hypercholesterolemia, unspecified: Secondary | ICD-10-CM | POA: Diagnosis not present

## 2019-11-14 DIAGNOSIS — I10 Essential (primary) hypertension: Secondary | ICD-10-CM

## 2019-11-14 DIAGNOSIS — R42 Dizziness and giddiness: Secondary | ICD-10-CM | POA: Diagnosis not present

## 2019-11-14 DIAGNOSIS — R0789 Other chest pain: Secondary | ICD-10-CM | POA: Diagnosis not present

## 2019-11-14 MED ORDER — HYDROCHLOROTHIAZIDE 12.5 MG PO TABS: ORAL_TABLET | ORAL | 3 refills | Status: DC

## 2019-11-14 NOTE — Progress Notes (Signed)
Cardiology Office Note   Date:  11/14/2019   ID:  [name], DOB 1981-05-28, MRN [id number]  PCP:  Cyndi Bender, PA-C  Cardiologist:   Skeet Latch, MD   No chief complaint on file.    History of Present Illness: Craig Powell is a 38 y.o. male with hypertension, hyperlipidemia and GERD who presents for follow-up. He reports borderline hypertension since he was in college. He started on losartan 04/20/16 and the dose was increased 07/2016. He was also started on pravastatin due to hyperlipidemia his concern about statins worsening his pre-diabetes.  Craig Powell reported atypical chest was and was referred for ETT on 04/30/16 that was negative for ischemia.  He developed rhabdomyolysis with a CPK greater than 50,000. He was hospitalized and had acute renal insufficiency. His creatinine improved to 1 at the time discharge. His blood pressures remained elevated so amlodipine was added to his regimen.  However this was stopped due to edema.  HCTZ was added and losartan was increased.  He had atypical chest pain that improved with treatment for GERD.  Coronary CT-8 on 03/2017 showed no coronary disease and had a calcium score of 0.  At his last appointment Craig Powell was started on Zetia.  However he stopped it due to diarrhea.  Overall he has been feeling well.  For the last month he has noted some tension in his left chest. It seems to occur randomly often at rest. He has also been stressed lately due to the coronavirus and his baby son needs surgery.  Since his last appointment his HCTZ ran out and he didn't refill it b/c his BP has been controlled.  He continues to have dizziness when standing or walking up hills.  He has been trying to walk more by pushing his son in his stroller.  He notices that he is dizzy when he gets to the top of the hill.  He has been working on his diet but is frustrated that he isn't losing weight.  He walks for exercise daily.   Past Medical History:   Diagnosis Date  . Essential hypertension 04/22/2016  . GERD (gastroesophageal reflux disease) 11/24/2017  . Hyperlipidemia   . Hypertension   . Obesity (BMI 30.0-34.9) 04/22/2016    Past Surgical History:  Procedure Laterality Date  . HERNIA REPAIR     umbilical, and right groin    Current Outpatient Medications  Medication Sig Dispense Refill  . losartan (COZAAR) 100 MG tablet TAKE 1 TABLET BY MOUTH EVERY DAY 90 tablet 3  . omeprazole (PRILOSEC) 40 MG capsule Take 1 capsule (40 mg total) by mouth daily. 90 capsule 3  . hydrochlorothiazide (HYDRODIURIL) 12.5 MG tablet TAKE 1/2 TABLET DAILY 45 tablet 3   No current facility-administered medications for this visit.     Allergies:   Pravastatin, Amlodipine, and Zetia [ezetimibe]    Social History:  The patient  reports that he has never smoked. He has never used smokeless tobacco. He reports that he does not drink alcohol or use drugs.   Family History:  The patient's family history includes Cancer in his paternal grandmother; Heart attack in his maternal grandfather and paternal grandfather; Hypertension in his father, maternal grandfather, and paternal grandfather.    ROS:  Please see the history of present illness.  Otherwise, review of systems are positive for none.   All other systems are reviewed and negative.    PHYSICAL EXAM: VS:  BP 126/82   Pulse 63   Temp  97.7 F (36.5 C)   Ht 5\' 7"  (1.702 m)   Wt 210 lb (95.3 kg)   SpO2 97%   BMI 32.89 kg/m  , BMI Body mass index is 32.89 kg/m. GENERAL:  Well appearing HEENT: Pupils equal round and reactive, fundi not visualized, oral mucosa unremarkable NECK:  No jugular venous distention, waveform within normal limits, carotid upstroke brisk and symmetric, no bruits, no thyromegaly LYMPHATICS:  No cervical adenopathy LUNGS:  Clear to auscultation bilaterally HEART:  RRR.  PMI not displaced or sustained,S1 and S2 within normal limits, no S3, no S4, no clicks, no rubs, no  murmurs ABD:  Flat, positive bowel sounds normal in frequency in pitch, no bruits, no rebound, no guarding, no midline pulsatile mass, no hepatomegaly, no splenomegaly EXT:  2 plus pulses throughout, no edema, no cyanosis no clubbing SKIN:  No rashes no nodules NEURO:  Cranial nerves II through XII grossly intact, motor grossly intact throughout PSYCH:  Cognitively intact, oriented to person place and time Which is  EKG:  EKG is ordered today. The ekg ordered 04/20/16 demonstrates sinus rhythm. 80 bpm. Cannot rule out prior inferior MI. 11/24/17: Sinus rhythm.  Rate 66 bpm.  Keep taking it for a few days 11/14/19: Sinus rhythm.  Rate 60 bpm.  ETT 04/30/16:   Blood pressure demonstrated a hypertensive response to exercise.  There was no ST segment deviation noted during stress.  Normal exercise tolerance test at execellent work of 13.7 mets and adequate heart rate response.  Lowr risk study.   Recent Labs: No results found for requested labs within last 8760 hours.   Lipid Panel    Component Value Date/Time   CHOL 209 (H) 04/18/2017 1100   TRIG 147 04/18/2017 1100   HDL 36 (L) 04/18/2017 1100   CHOLHDL 5.8 (H) 04/18/2017 1100   CHOLHDL 4.5 10/07/2016 0941   VLDL 26 10/07/2016 0941   LDLCALC 144 (H) 04/18/2017 1100    07/27/16: Total cholesterol 242, triglycerides 161, HDL 39, LDL 171.  09/26/2018: Total cholesterol 250, triglycerides 176, HDL 40, LDL 175 09/27/2019: Total cholesterol 237, triglycerides 148, HDL 35, LDL 175  Wt Readings from Last 3 Encounters:  11/14/19 210 lb (95.3 kg)  11/29/18 213 lb 12.8 oz (97 kg)  11/24/17 212 lb 9.6 oz (96.4 kg)      ASSESSMENT AND PLAN:  # Hypertension: BP is well-controlled on losartan.  However I suspect that it may be getting elevated when he exercises which may contribute to dizziness.  He will start HCTZ 6.25 mg daily instead of 12.5 mg which he was previously taking.   # Chest pain: Mostly resolved with treatment of GERD.   Current symptoms are atypical and not consistent with exertion.  He had no coronary disease on CT-A.  # Hyperlipidemia: Cholesterol levels were much better-controlled on pravastatin.  However, he developed severe rhabdomyolysis with exercise.  He does not want to try other statins.   ASCVD 10 year risk is 2.5% and no plaque seen on cardiac CT-A.  He had diarrhea on Zetia.  He is unlikely to qualify for bempidoic acid or PCSK9 inhibitor given lack of FH or ASCVD.  Given his family history he wants to be proactive.  Will have him see Dr. Rennis GoldenHilty to see what other options he may have.     Current medicines are reviewed at length with the patient today.  The patient does not have concerns regarding medicines.  The following changes have been made: none  Labs/  tests ordered today include:   Orders Placed This Encounter  Procedures  . AMB Referral to [facility]  . EKG 12-Lead     Disposition:   FU with Craig Powell C. Craig Salvia, MD, [facility] in 2 months virtual.  Signed, Adeli Frost C. Craig Salvia, MD, [facility]  11/14/2019 12:57 PM    [facility]

## 2019-11-14 NOTE — Patient Instructions (Signed)
Medication Instructions:  RESTART HYDROCHLOROTHIAZIDE 12.5 MG 1/2 TABLET DAILY   *If you need a refill on your cardiac medications before your next appointment, please call your pharmacy*  Lab Work: NONE   Testing/Procedures: NONE  Follow-Up: At [facility], you and your health needs are our priority.  As part of our continuing mission to provide you with exceptional heart care, we have created designated Provider Care Teams.  These Care Teams include your primary Cardiologist (physician) and Advanced Practice Providers (APPs -  Physician Assistants and Nurse Practitioners) who all work together to provide you with the care you need, when you need it.  Your next appointment:   2 month(s)  The format for your next appointment:   Virtual Visit   Provider:   You may see DR [facility]  or one of the following Advanced Practice Providers on your designated Care Team:    Kerin Ransom, PA-C  [place], [place]  Coletta Memos, [place]  You have been referred to DR HILTY FOR LIPIDS

## 2019-11-30 ENCOUNTER — Ambulatory Visit: Payer: 59 | Admitting: Cardiovascular Disease

## 2019-12-11 ENCOUNTER — Other Ambulatory Visit: Payer: Self-pay | Admitting: Cardiovascular Disease

## 2019-12-16 ENCOUNTER — Other Ambulatory Visit: Payer: Self-pay | Admitting: Cardiovascular Disease

## 2020-01-04 ENCOUNTER — Other Ambulatory Visit: Payer: Self-pay

## 2020-01-04 ENCOUNTER — Telehealth: Payer: Self-pay | Admitting: Internal Medicine

## 2020-01-04 ENCOUNTER — Encounter: Payer: Self-pay | Admitting: Internal Medicine

## 2020-01-04 ENCOUNTER — Ambulatory Visit: Payer: 59 | Admitting: Internal Medicine

## 2020-01-04 VITALS — BP 122/84 | HR 60 | Ht 67.0 in | Wt 211.0 lb

## 2020-01-04 DIAGNOSIS — E7800 Pure hypercholesterolemia, unspecified: Secondary | ICD-10-CM | POA: Diagnosis not present

## 2020-01-04 DIAGNOSIS — Z8249 Family history of ischemic heart disease and other diseases of the circulatory system: Secondary | ICD-10-CM | POA: Diagnosis not present

## 2020-01-04 DIAGNOSIS — Z8739 Personal history of other diseases of the musculoskeletal system and connective tissue: Secondary | ICD-10-CM | POA: Diagnosis not present

## 2020-01-04 NOTE — Progress Notes (Signed)
LIPID CLINIC CONSULT NOTE  Chief Complaint:  Dyslipidemia  Primary Care Physician: Cyndi Bender, PA-C  Primary Cardiologist:  No primary care provider on file.  HPI:  Craig Powell is a 39 y.o. male who is being seen today for the evaluation of dyslipidemia at the request of Dr. Oval Linsey.  This is a pleasant 39 year old male with a strong family history of coronary disease both in his father had premature coronary disease and bypass surgery in his late 15s, as well as multiple aunts and uncles with high cholesterol and early coronary disease.  He is of [place] descent from the [place] area, which is an area of a population known to have a higher prevalence of familial hyperlipidemia.  He establish care with Dr. Oval Linsey and had a coronary artery CT scan about 2 years ago which showed no coronary disease and no coronary calcium, fortunately although he has had persistently elevated dyslipidemia despite attempts at dietary modification.  LDL generally runs about 175.  Unfortunately he has been unable to take statins and suffered rhabdomyolysis related to statin therapy causing renal failure and he was quite sick.  Therefore he is not a candidate for statins in the future.  In addition he had significant diarrhea and intolerance on ezetimibe and is intolerant to that.  This leaves actually few treatment options.  PMHx:  Past Medical History:  Diagnosis Date  . Essential hypertension 04/22/2016  . GERD (gastroesophageal reflux disease) 11/24/2017  . Hyperlipidemia   . Hypertension   . Obesity (BMI 30.0-34.9) 04/22/2016    Past Surgical History:  Procedure Laterality Date  . HERNIA REPAIR     umbilical, and right groin    FAMHx:  Family History  Problem Relation Age of Onset  . Hypertension Father   . Hypertension Maternal Grandfather   . Heart attack Maternal Grandfather   . Cancer Paternal Grandmother   . Hypertension Paternal Grandfather   . Heart attack Paternal  Grandfather   . Colon cancer Neg Hx     SOCHx:   reports that he has never smoked. He has never used smokeless tobacco. He reports that he does not drink alcohol or use drugs.  ALLERGIES:  Allergies  Allergen Reactions  . Pravastatin     Rhabdomyolysis  . Amlodipine     SWELLING   . Zetia [Ezetimibe] Diarrhea    ROS: Pertinent items noted in HPI and remainder of comprehensive ROS otherwise negative.  HOME MEDS: Current Outpatient Medications on File Prior to Visit  Medication Sig Dispense Refill  . hydrochlorothiazide (HYDRODIURIL) 12.5 MG tablet TAKE 1/2 TABLET DAILY 45 tablet 3  . losartan (COZAAR) 100 MG tablet Take 1 tablet by mouth once daily 90 tablet 0  . omeprazole (PRILOSEC) 40 MG capsule TAKE 1 CAPSULE BY MOUTH EVERY DAY 90 capsule 0   No current facility-administered medications on file prior to visit.    LABS/IMAGING: No results found for this or any previous visit (from the past 48 hour(s)). No results found.  LIPID PANEL:    Component Value Date/Time   CHOL 209 (H) 04/18/2017 1100   TRIG 147 04/18/2017 1100   HDL 36 (L) 04/18/2017 1100   CHOLHDL 5.8 (H) 04/18/2017 1100   CHOLHDL 4.5 10/07/2016 0941   VLDL 26 10/07/2016 0941   LDLCALC 144 (H) 04/18/2017 1100    WEIGHTS: Wt Readings from Last 3 Encounters:  01/04/20 211 lb (95.7 kg)  11/14/19 210 lb (95.3 kg)  11/29/18 213 lb 12.8 oz (97 kg)  VITALS: BP 122/84   Pulse 60   Ht 5\' 7"  (1.702 m)   Wt 211 lb (95.7 kg)   SpO2 97%   BMI 33.05 kg/m   EXAM: General appearance: alert and no distress Neck: no carotid bruit, no JVD and thyroid not enlarged, symmetric, no tenderness/mass/nodules Lungs: clear to auscultation bilaterally Heart: regular rate and rhythm, S1, S2 normal, no murmur, click, rub or gallop Abdomen: soft, non-tender; bowel sounds normal; no masses,  no organomegaly Extremities: extremities normal, atraumatic, no cyanosis or edema Pulses: 2+ and symmetric Skin: Skin color,  texture, turgor normal. No rashes or lesions Neurologic: Grossly normal Psych: Pleasant  EKG: Deferred  ASSESSMENT: 1. Possible familial hyperlipidemia 2. Strong family history of premature CAD 3. 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 [place] heritage  PLAN: 1.   Mr. has a possible familial hyperlipidemia, however low Dutch score of 2 (which would be considered unlikely).  His LDL-C has generally run below 190 however he has made dietary modifications to achieve this.  He did not have any evidence of premature coronary disease as far as his CT coronary angiogram demonstrated.  That being said, there is family history of premature heart disease which is strong as well as a significant elevation of cholesterol amongst numerous family members.  Is possible that he may have a familial hyperlipidemia.  Data shows that there are many patients with FH that have LDL-C as low as 150 mg/dL.  Unfortunately, standard risk calculator cannot stratify him given his younger age.  I do however feel that he is at intermediate risk and would benefit from additional LDL lowering.  Best and safest option would be PCSK9 inhibitor if there is evidence for FH.  I will also submit for genetic testing.  We could additionally consider bempedoic acid however is not clear whether he will potentially develop a rhabdomyolysis with this.  In the CLEAR trial, however, this was very unlikely.   Thanks for the kind referral. Follow-up with me in 3-4 months.  Elie Confer, MD, [facility], FACP  [place]  [facility]  Medical Director of the Advanced Lipid Disorders &  Cardiovascular Risk Reduction Clinic Diplomate of the American Board of Clinical Lipidology Attending Cardiologist  Direct Dial: [phone]  Fax: [phone]  Website:  [email or web address] 01/04/2020, 9:37 AM

## 2020-01-04 NOTE — Telephone Encounter (Signed)
PA for Repatha [employer] submitted via [email or web address] (Key: [id number])

## 2020-01-04 NOTE — Patient Instructions (Addendum)
Medication Instructions:  Dr. Rennis Golden recommends REPATHA (PCSK9). This is an injectable cholesterol medication self-administered. This medication will need prior approval with your insurance company, which we will work on. If the medication is not approved initially, we may need to do an appeal with your insurance. We will keep you updated on this process.   If you need co-pay assistance, please look into the program at [email or web address] >> disease funds >> hypercholesterolemia. This is an online application or you can call to completed.     *If you need a refill on your cardiac medications before your next appointment, please call your pharmacy*  Lab Work: If you have labs (blood work) drawn today and your tests are completely normal, you will receive your results only by: Marland Kitchen MyChart Message (if you have MyChart) OR . A paper copy in the mail If you have any lab test that is abnormal or we need to change your treatment, we will call you to review the results.  Follow-Up: At [facility], you and your health needs are our priority.  As part of our continuing mission to provide you with exceptional heart care, we have created designated Provider Care Teams.  These Care Teams include your primary Cardiologist (physician) and Advanced Practice Providers (APPs -  Physician Assistants and Nurse Practitioners) who all work together to provide you with the care you need, when you need it.  Your next appointment:   4 month(s)  The format for your next appointment:   Either In Person or Virtual  Provider:   K. [place] Hilty, MD  REFERRAL TO DR Doristine Locks FOR GENETIC TESTING

## 2020-01-07 ENCOUNTER — Other Ambulatory Visit: Payer: Self-pay | Admitting: Internal Medicine

## 2020-01-07 NOTE — Telephone Encounter (Signed)
Medication approved until July 03, 2020

## 2020-01-10 ENCOUNTER — Ambulatory Visit: Payer: 59 | Admitting: Genetic Counselor

## 2020-01-10 ENCOUNTER — Other Ambulatory Visit: Payer: Self-pay

## 2020-01-10 DIAGNOSIS — E7800 Pure hypercholesterolemia, unspecified: Secondary | ICD-10-CM

## 2020-01-10 DIAGNOSIS — Z8249 Family history of ischemic heart disease and other diseases of the circulatory system: Secondary | ICD-10-CM

## 2020-01-10 MED ORDER — REPATHA SURECLICK 140 MG/ML ~~LOC~~ SOAJ: 1.0000 | SUBCUTANEOUS | 11 refills | Status: DC

## 2020-01-10 NOTE — Telephone Encounter (Signed)
Rx sent to pharmacy   

## 2020-01-10 NOTE — Addendum Note (Signed)
Addended by: Lindell Spar on: 01/10/2020 03:35 PM   Modules accepted: Orders

## 2020-01-16 ENCOUNTER — Encounter: Payer: Self-pay | Admitting: *Deleted

## 2020-01-21 ENCOUNTER — Telehealth (INDEPENDENT_AMBULATORY_CARE_PROVIDER_SITE_OTHER): Payer: 59 | Admitting: Cardiovascular Disease

## 2020-01-21 ENCOUNTER — Encounter: Payer: Self-pay | Admitting: Cardiovascular Disease

## 2020-01-21 VITALS — BP 135/83 | HR 67 | Ht 67.0 in

## 2020-01-21 DIAGNOSIS — R079 Chest pain, unspecified: Secondary | ICD-10-CM

## 2020-01-21 DIAGNOSIS — R7303 Prediabetes: Secondary | ICD-10-CM

## 2020-01-21 DIAGNOSIS — E785 Hyperlipidemia, unspecified: Secondary | ICD-10-CM | POA: Diagnosis not present

## 2020-01-21 DIAGNOSIS — I10 Essential (primary) hypertension: Secondary | ICD-10-CM | POA: Diagnosis not present

## 2020-01-21 DIAGNOSIS — K219 Gastro-esophageal reflux disease without esophagitis: Secondary | ICD-10-CM | POA: Diagnosis not present

## 2020-01-21 DIAGNOSIS — Z5181 Encounter for therapeutic drug level monitoring: Secondary | ICD-10-CM

## 2020-01-21 DIAGNOSIS — E7800 Pure hypercholesterolemia, unspecified: Secondary | ICD-10-CM

## 2020-01-21 DIAGNOSIS — Z7189 Other specified counseling: Secondary | ICD-10-CM

## 2020-01-21 NOTE — Addendum Note (Signed)
Addended by: Lindell Spar on: 01/21/2020 02:21 PM   Modules accepted: Orders

## 2020-01-21 NOTE — Progress Notes (Signed)
Virtual Visit via Video Note   This visit type was conducted due to national recommendations for restrictions regarding the COVID-19 Pandemic (e.g. social distancing) in an effort to limit this patient's exposure and mitigate transmission in our community.  Due to his co-morbid illnesses, this patient is at least at moderate risk for complications without adequate follow up.  This format is felt to be most appropriate for this patient at this time.  All issues noted in this document were discussed and addressed.  A limited physical exam was performed with this format.  Please refer to the patient's chart for his consent to telehealth for [facility].   Date:  01/21/2020   ID:  Craig Powell, DOB 1981/09/28, MRN [id number]  Patient Location: Home Provider Location: Home  PCP:  Cyndi Bender, PA-C  Cardiologist:  No primary care provider on file.  Electrophysiologist:  None   Evaluation Performed:  Follow-Up Visit  Chief Complaint:  Hyperlipidemia, hypertension  History of Present Illness:    Craig Powell is a 39 y.o. male with hypertension, hyperlipidemia and GERD who presents for follow-up.  He was diagnosed with hypertension in college. He started on losartan 04/20/16 and the dose was increased 07/2016.  He was also started on pravastatin due to hyperlipidemia his concern about statins worsening his pre-diabetes.  Craig Powell reported atypical chest was and was referred for ETT on 04/30/16 that was negative for ischemia.  He developed rhabdomyolysis with a CPK greater than 50,000. He was hospitalized and had acute renal insufficiency. His creatinine normalized by discharge. His blood pressures remained elevated so amlodipine was added to his regimen but stopped due to edema.  HCTZ was added and losartan was increased.  He had atypical chest pain that improved with treatment for GERD.  Coronary CT-A on 03/2017 showed no coronary disease and had a calcium score of 0.  Craig Powell was  started on Zetia.  However he stopped it due to diarrhea.  He was referred to Dr. Debara Pickett and started on [place].  However he has not yet started the medication due to concern it will cause diabetes.  Overall he has been feeling well.  At his last appointment he noted dizziness with exertion.  There was concern it may be due to elevated BP.  He started HCTZ 6.25mg .  He hasn't noted any change in symptoms, though he isn't exercising much.  In general his BP has been running in the 120s/80s.One time his BP was in the 100s immediately after exercise.  He still has some dizziness when going up hills.  He notices it when getting up from standing or with exertion.  He has no exertional chest pain, shortness of breath or edema.    The patient does not have symptoms concerning for COVID-19 infection (fever, chills, cough, or new shortness of breath).    Past Medical History:  Diagnosis Date  . Essential hypertension 04/22/2016  . GERD (gastroesophageal reflux disease) 11/24/2017  . Hyperlipidemia   . Hypertension   . Obesity (BMI 30.0-34.9) 04/22/2016   Past Surgical History:  Procedure Laterality Date  . HERNIA REPAIR     umbilical, and right groin     Current Meds  Medication Sig  . hydrochlorothiazide (HYDRODIURIL) 12.5 MG tablet TAKE 1/2 TABLET DAILY  . losartan (COZAAR) 100 MG tablet Take 1 tablet by mouth once daily  . omeprazole (PRILOSEC) 40 MG capsule TAKE 1 CAPSULE BY MOUTH EVERY DAY     Allergies:   Pravastatin, Amlodipine, and Zetia [  ezetimibe]   Social History   Tobacco Use  . Smoking status: Never Smoker  . Smokeless tobacco: Never Used  Substance Use Topics  . Alcohol use: No  . Drug use: No     Family Hx: The patient's family history includes Cancer in his paternal grandmother; Heart attack in his maternal grandfather and paternal grandfather; Hypertension in his father, maternal grandfather, and paternal grandfather. There is no history of Colon cancer.  ROS:   Please  see the history of present illness.    All other systems reviewed and are negative.   Prior CV studies:   The following studies were reviewed today:  Coronary CT-A 04/22/17: 1. Coronary calcium score of 0. This was 0 percentile for age and sex matched control.  2. Normal coronary origin with right dominance.  3. No evidence of CAD.  ETT 04/30/16:   Blood pressure demonstrated a hypertensive response to exercise.  There was no ST segment deviation noted during stress.  Normal exercise tolerance test at execellent work of 13.7 mets and adequate heart rate response.  Low risk study.  Labs/Other Tests and Data Reviewed:    EKG:  No ECG reviewed.  Recent Labs: No results found for requested labs within last 8760 hours.   Recent Lipid Panel Lab Results  Component Value Date/Time   CHOL 209 (H) 04/18/2017 11:00 AM   TRIG 147 04/18/2017 11:00 AM   HDL 36 (L) 04/18/2017 11:00 AM   CHOLHDL 5.8 (H) 04/18/2017 11:00 AM   CHOLHDL 4.5 10/07/2016 09:41 AM   LDLCALC 144 (H) 04/18/2017 11:00 AM   07/27/16: Total cholesterol 242, triglycerides 161, HDL 39, LDL 171.  09/26/2018: Total cholesterol 250, triglycerides 176, HDL 40, LDL 175 09/27/2019: Total cholesterol 237, triglycerides 148, HDL 35, LDL 175   Wt Readings from Last 3 Encounters:  01/04/20 211 lb (95.7 kg)  11/14/19 210 lb (95.3 kg)  11/29/18 213 lb 12.8 oz (97 kg)     Objective:    Vital Signs:  BP 139/88   Pulse 67   Ht 5\' 7"  (1.702 m)   BMI 33.05 kg/m    VITAL SIGNS:  reviewed GEN:  no acute distress EYES:  sclerae anicteric, EOMI - Extraocular Movements Intact RESPIRATORY:  normal respiratory effort, symmetric expansion CARDIOVASCULAR:  no peripheral edema NEURO:  alert and oriented x 3, no obvious focal deficit  ASSESSMENT & PLAN:    # Hypertension: BP is well-controlled on losartan and HCTZ.  HCTZ was added at the last appointment 2/2 dizziness with exertion.  However his BP hasn't changed fmuch and  he still has dizziness.  He notes that it is low after exercise.  Stop HCTZ and continue losartan.  Track BP.   # Chest pain: Mostly resolved with treatment of GERD.  Current symptoms are atypical and not consistent with exertion.  He had no coronary disease on CT-A.  # Hyperlipidemia: Cholesterol levels were much better-controlled on pravastatin.  However, he developed severe rhabdomyolysis with exercise.  He did not tolerate Zetia.  He saw Dr. and Repatha was prescribed.  He is concerned about elevated glucose but willing to try.  Check lipids/CMP in 2 months.   COVID-19 Education: The signs and symptoms of COVID-19 were discussed with the patient and how to seek care for testing (follow up with PCP or arrange E-visit).  The importance of social distancing was discussed today.  Time:   Today, I have spent 21 minutes with the patient with telehealth technology discussing the  above problems.     Medication Adjustments/Labs and Tests Ordered: Current medicines are reviewed at length with the patient today.  Concerns regarding medicines are outlined above.   Tests Ordered: No orders of the defined types were placed in this encounter.   Medication Changes: No orders of the defined types were placed in this encounter.   Follow Up:  In Person 6 months.  Signed, Chilton Si, MD  01/21/2020 7:59 AM    [facility]

## 2020-01-21 NOTE — Patient Instructions (Signed)
Medication Instructions: STOP HYDROCHLOROTHIAZIDE   *If you need a refill on your cardiac medications before your next appointment, please call your pharmacy*  Lab Work: FASTING LP/CMET IN 2 MONTHS   If you have labs (blood work) drawn today and your tests are completely normal, you will receive your results only by: Marland Kitchen MyChart Message (if you have MyChart) OR . A paper copy in the mail If you have any lab test that is abnormal or we need to change your treatment, we will call you to review the results.  Testing/Procedures: NONE   Follow-Up: At [facility], you and your health needs are our priority.  As part of our continuing mission to provide you with exceptional heart care, we have created designated Provider Care Teams.  These Care Teams include your primary Cardiologist (physician) and Advanced Practice Providers (APPs -  Physician Assistants and Nurse Practitioners) who all work together to provide you with the care you need, when you need it.  Your next appointment:   6 month(s)  The format for your next appointment:   Either In Person or Virtual  Provider:   You may see Chilton Si, MD or one of the following Advanced Practice Providers on your designated Care Team:    Corine Shelter, PA-C  [place], [place]  Edd Fabian, [place]   Other Instructions  MONITOR AND LOG YOU BLOOD PRESSURE TWICE A DAY

## 2020-01-21 NOTE — Progress Notes (Addendum)
Pre-test Genetic Consult notes   Referring Provider: Kenneth Italy Hilty, MD   Referral Reason Higinio Grow was referred for genetic consult and testing of familial hypercholesterolemia (FH).  Genetic Consultation Notes  We walked through the risk factors that can lead to hypercholesterolemia. I explained to Hazaiah that the characteristic features of a genetic condition include absence of risk factors that can lead to elevated LDL-C, early age of presentation, increased disease severity and family history of the condition. The clinical manifestations of FH were also reviewed. He denies having xanthomas, corneal arcus or a heart attack.  I discussed the molecular pathogenesis of FH. I informed him that FH is primarily caused by pathogenic variants in three genes, namely APOB, LDLR and PCSK9. These pathogenic variants impact LDLR synthesis, degradation and recycling in cells and results in elevated LDL-C levels. We then walked through autosomal dominant inheritance pattern and viewed pedigree of families with heterozygous FH (HeFH) and homozygous FH (HoFH). Explained to him that digenic or compound heterozygous mutations in APOB, LDLR and PCSK9 genes can cause HoFH.   We reviewed the likely outcomes of FH genetic testing. Based on the diagnostic criteria for FH, yields can range from 37%-90%. A positive yield is observed in  ~63% of patients with a definite clinical diagnosis of FH. A negative test does not exclude a genetic basis for FH. In some cases, polygenic inheritance where more than an average number of common variants, each with small effect, are known to increase plasma lipid levels. Additionally, limitations in current genetic testing methodology can produce a negative result. Variants of unknown significance (VUS) can be seen in some cases. I explained that typically a VUS is so classified if the variant is not well understood as very few individuals have been reported to harbor this variant  or its role in gene function has not been elucidated. Screening other first-degree family members by genetic testing was also discussed. Additionally, we briefly touched upon the molecular basis of the different treatment modalities that are currently available.  His medical and 4-generation family history was obtained. See details below-  Personal Medical Information Kyrel (III.2 on pedigree) is a pleasant 39 year old Caucasian man of [place] descent. He informs me that he had a medical workup done soon after his father's quadruple bypass surgery about 4 years ago. He was found to have elevated cholesterol and was prescribed statins. He reports having rhabdomyolysis and subsequent acute renal failure after 9 months of statin treatment. He was switched to zetia that led to daily bouts of severe diarrhea. This medication too was stopped. He is currently not on medication and has made lifestyle change sin his diet and exercise regimen to control his cholesterol levels. His LDL-C levels, per his latest lipid panel, is still elevated at 175.  Risk Factors Alessander denies having hypothyroidism, renal or hepatic dysfunction that can also lead to FH. He does not smoke and does not have diabetes. He was diagnosed with hypertension at 68 and states that it well controlled with medication. He also affirms having a healthy diet.  Family history Vitaliy (III.5) has a 51 month-old son (IV.2). He has an elder brother; age 21 (III.1) who does not have elevated cholesterol levels. He reports hypercholesterolemia, diagnosed at age 9, in his father (II.2). He reports that his dad began active treatment for high cholesterol at age 29 but underwent a cardiac quadruple bypass at age 38. His younger sister, age 87 (II.1) leads an active lifestyle and does not have  elevated cholesterol. Kenwood's paternal grandfather (I.1) suffered several heart attacks in his 69s-60s and was also diagnosed with hypercholesterolemia. He  subsequently died of a heart attack at age 42.  Vernor's mother (II.3) is now 55 and was found to have high cholesterol but has had no adverse events. All her siblings, except a brother (II.5) have hypercholesterolemia that was diagnosed in their 20a, but have had no adverse events. His maternal grandfather (I.3) also had several heart attacks in his 78s-50s and passed away from complications of a heart attack at age 81.  Impression and Plans  In summary, Keigo was diagnosed with hypercholesterolemia  at age 26 in the absence of typical risk factors for this condition. There is also a significant family history of CAD in a first-degree relative (father) and hypercholesterolemia in several relatives in his maternal relatives. It is likely that Maximus has a genetic condition that is likely inherited from his father.  In light of his age and severity of presentation and significant family history of CAD, genetic testing is highly recommended. Genetic testing should evaluate the major genes implicated in familial hypercholesterolemia. Since this is an autosomal dominant condition, hIS son is at a 50% risk of inheriting it. Grace verbalized understanding of this.  In addition, we discussed the protections afforded by the Genetic Information Non-Discrimination Act (GINA). I explained to him that GINA protects him from losing his employment or health insurance based on his genotype. However, these protections do not cover life insurance and disability. He verbalized understanding of this.  Please note that the patient has not been counseled in this visit on personal, cultural or ethical issues that he may face due to his heart condition.   Plans Lathaniel would like to pursue genetic testing. Blood was drawn today and sent for testing.   Lattie Corns, Ph.D, [facility] Clinical Molecular Geneticist

## 2020-01-22 ENCOUNTER — Other Ambulatory Visit: Payer: Self-pay | Admitting: Internal Medicine

## 2020-01-22 DIAGNOSIS — Z8739 Personal history of other diseases of the musculoskeletal system and connective tissue: Secondary | ICD-10-CM

## 2020-01-22 DIAGNOSIS — Z8249 Family history of ischemic heart disease and other diseases of the circulatory system: Secondary | ICD-10-CM

## 2020-01-22 DIAGNOSIS — E7800 Pure hypercholesterolemia, unspecified: Secondary | ICD-10-CM

## 2020-03-08 ENCOUNTER — Other Ambulatory Visit: Payer: Self-pay | Admitting: Cardiovascular Disease

## 2020-03-25 ENCOUNTER — Other Ambulatory Visit: Payer: Self-pay | Admitting: Cardiovascular Disease

## 2020-03-27 ENCOUNTER — Other Ambulatory Visit: Payer: Self-pay | Admitting: Cardiovascular Disease

## 2020-04-21 LAB — COMPREHENSIVE METABOLIC PANEL
ALT: 13 IU/L (ref 0–44)
AST: 13 IU/L (ref 0–40)
Albumin/Globulin Ratio: 1.9 (ref 1.2–2.2)
Albumin: 4.6 g/dL (ref 4.0–5.0)
Alkaline Phosphatase: 57 IU/L (ref 39–117)
BUN/Creatinine Ratio: 11 (ref 9–20)
BUN: 12 mg/dL (ref 6–20)
Bilirubin Total: 0.4 mg/dL (ref 0.0–1.2)
CO2: 22 mmol/L (ref 20–29)
Calcium: 9.7 mg/dL (ref 8.7–10.2)
Chloride: 102 mmol/L (ref 96–106)
Creatinine, Ser: 1.07 mg/dL (ref 0.76–1.27)
GFR calc Af Amer: 103 mL/min/{1.73_m2} (ref >59)
GFR calc non Af Amer: 89 mL/min/{1.73_m2} (ref >59)
Globulin, Total: 2.4 g/dL (ref 1.5–4.5)
Glucose: 103 mg/dL — ABNORMAL HIGH (ref 65–99)
Potassium: 4.7 mmol/L (ref 3.5–5.2)
Sodium: 140 mmol/L (ref 134–144)
Total Protein: 7 g/dL (ref 6.0–8.5)

## 2020-04-21 LAB — LIPID PANEL
Chol/HDL Ratio: 2.9 ratio (ref 0.0–5.0)
Cholesterol, Total: 106 mg/dL (ref 100–199)
HDL: 37 mg/dL — ABNORMAL LOW (ref >39)
LDL Chol Calc (NIH): 47 mg/dL (ref 0–99)
Triglycerides: 121 mg/dL (ref 0–149)
VLDL Cholesterol Cal: 22 mg/dL (ref 5–40)

## 2020-06-04 ENCOUNTER — Telehealth: Payer: Self-pay | Admitting: Internal Medicine

## 2020-06-04 NOTE — Telephone Encounter (Signed)
PA for repatha sureclick submitted via CMM  (Key: [id number])

## 2020-06-05 ENCOUNTER — Telehealth: Payer: Self-pay | Admitting: Cardiovascular Disease

## 2020-06-05 NOTE — Telephone Encounter (Signed)
Pt aware of Dr Blanchie Dessert response and will call PCP .Craig Powell

## 2020-06-05 NOTE — Telephone Encounter (Signed)
STAT if patient feels like he/she is going to faint   1) Are you dizzy now? Mom says he is when he stands up  2) Do you feel faint or have you passed out? no  3) Do you have any other symptoms? Vomiting   4) Have you checked your HR and BP (record if available)? No  Pt c/o medication issue:  1. Name of Medication:  Evolocumab (REPATHA SURECLICK) 140 MG/ML SOAJ  2. How are you currently taking this medication (dosage and times per day)? As directed   3. Are you having a reaction (difficulty breathing--STAT)? Dizziness   4. What is your medication issue? Pt has been dizzy since starting the shot and Mom is concerned about it.  Mother said that the best way to reach him during the day is via his work number because his cell does not get good service

## 2020-06-05 NOTE — Telephone Encounter (Signed)
We don't usually see side effects beyond 2 days after injection - I suspect it is not the medicine.  Dr Rexene Edison

## 2020-06-05 NOTE — Telephone Encounter (Signed)
Request Reference Number: [id number]. REPATHA SURE INJ 140MG /ML is approved through 06/04/2021

## 2020-06-05 NOTE — Telephone Encounter (Signed)
Spoke with pt and has been taking Repatha since the beginning of the year and is currently taking every 2 weeks and is due to take this Sat 6/12/21Pt is complaining with nausea for long period of time and dizziness for a week and yesterday morning dizziness was so bad almost fell when getting out of bed Per pt B/P was 148/87 today which is high for pt.Per pt does not tolerate statins due to severe reaction (acute kidney failure)Will forward to Dr Rennis Golden for review and recommendations/cy

## 2020-06-07 ENCOUNTER — Other Ambulatory Visit: Payer: Self-pay | Admitting: Cardiovascular Disease

## 2020-06-26 ENCOUNTER — Other Ambulatory Visit: Payer: Self-pay | Admitting: Cardiovascular Disease

## 2020-06-26 MED ORDER — LOSARTAN POTASSIUM 100 MG PO TABS: 100.0000 mg | ORAL_TABLET | Freq: Every day | ORAL | 1 refills | Status: DC

## 2020-06-26 NOTE — Telephone Encounter (Signed)
*  STAT* If patient is at the pharmacy, call can be transferred to refill team.   1. Which medications need to be refilled? (please list name of each medication and dose if known) losartan (COZAAR) 100 MG tablet  2. Which pharmacy/location (including street and city if local pharmacy) is medication to be sent to? [facility] [address] [place] - [address]  3. Do they need a 30 day or 90 day supply? 90 day   Patient will be out of medication tomorrow.

## 2020-08-12 ENCOUNTER — Ambulatory Visit: Payer: 59 | Admitting: Cardiovascular Disease

## 2020-08-12 ENCOUNTER — Other Ambulatory Visit: Payer: Self-pay

## 2020-08-12 ENCOUNTER — Encounter: Payer: Self-pay | Admitting: Cardiovascular Disease

## 2020-08-12 VITALS — BP 130/82 | HR 65 | Ht 67.0 in | Wt 208.6 lb

## 2020-08-12 DIAGNOSIS — E7800 Pure hypercholesterolemia, unspecified: Secondary | ICD-10-CM | POA: Diagnosis not present

## 2020-08-12 DIAGNOSIS — E669 Obesity, unspecified: Secondary | ICD-10-CM | POA: Diagnosis not present

## 2020-08-12 DIAGNOSIS — I10 Essential (primary) hypertension: Secondary | ICD-10-CM

## 2020-08-12 NOTE — Patient Instructions (Signed)
Medication Instructions:  Your physician recommends that you continue on your current medications as directed. Please refer to the Current Medication list given to you today.   *If you need a refill on your cardiac medications before your next appointment, please call your pharmacy*  Lab Work: NONE   Testing/Procedures: NONE  Follow-Up: At [facility], you and your health needs are our priority.  As part of our continuing mission to provide you with exceptional heart care, we have created designated Provider Care Teams.  These Care Teams include your primary Cardiologist (physician) and Advanced Practice Providers (APPs -  Physician Assistants and Nurse Practitioners) who all work together to provide you with the care you need, when you need it.  We recommend signing up for the patient portal called "MyChart".  Sign up information is provided on this After Visit Summary.  MyChart is used to connect with patients for Virtual Visits (Telemedicine).  Patients are able to view lab/test results, encounter notes, upcoming appointments, etc.  Non-urgent messages can be sent to your provider as well.   To learn more about what you can do with MyChart, go to [email or web address].    Your next appointment:   12 month(s) You will receive a reminder letter in the mail two months in advance. If you don't receive a letter, please call our office to schedule the follow-up appointment.   The format for your next appointment:   In Person  Provider:   You may see Tiffany Sherwood, MD or one of the following Advanced Practice Providers on your designated Care Team:    Luke Kilroy, PA-C  Callie Goodrich, PA-C  Jesse Cleaver, FNP    

## 2020-08-12 NOTE — Progress Notes (Signed)
Cardiology Office Note   Date:  08/12/2020   ID:  Craig Powell, DOB 04/20/1981, MRN [id number]  PCP:  Lonie Peak, PA-C  Cardiologist:  Chilton Si, MD  Electrophysiologist:  None   Evaluation Performed:  Follow-Up Visit  Chief Complaint:  Hyperlipidemia, hypertension  History of Present Illness:    Craig Powell is a 39 y.o. male with hypertension, hyperlipidemia and GERD who presents for follow-up.  He was diagnosed with hypertension in college. He started on losartan 04/20/16 and the dose was increased 07/2016.  He was also started on pravastatin due to hyperlipidemia his concern about statins worsening his pre-diabetes.  Mr. Decicco reported atypical chest was and was referred for ETT on 04/30/16 that was negative for ischemia.  He developed rhabdomyolysis with a CPK greater than 50,000. He was hospitalized and had acute renal insufficiency. His creatinine normalized by discharge. His blood pressures remained elevated so amlodipine was added to his regimen but stopped due to edema.  HCTZ was added and losartan was increased.  He had atypical chest pain that improved with treatment for GERD.  Coronary CT-A on 03/2017 showed no coronary disease and had a calcium score of 0.  Mr. Colvin was started on Zetia.  However he stopped it due to diarrhea.  He was referred to Dr. Rennis Golden and started on Repatha.  He noted dizziness with exertion.  There was concern it may be due to elevated BP.  He started HCTZ 6.25mg . At his lat appointment it was stopped b/c he was dizzy and his BP was unchanged.  Lately he has noted some more LE edema.  It is worse when he was wearing boots.  He fell down the stairs in April.  It took a long time for his left leg to heal.  He has no chest pain or shortness of breath.  He has been swimming for exercise but not walking as much. He hasn't been checking his BP much lately.  He is tolerating Repatha but notices that his stomach gets upset if he tries to eat fatty  foods.    Past Medical History:  Diagnosis Date  . Essential hypertension 04/22/2016  . GERD (gastroesophageal reflux disease) 11/24/2017  . Hyperlipidemia   . Hypertension   . Obesity (BMI 30.0-34.9) 04/22/2016   Past Surgical History:  Procedure Laterality Date  . HERNIA REPAIR     umbilical, and right groin     Current Meds  Medication Sig  . Evolocumab (REPATHA SURECLICK) 140 MG/ML SOAJ Inject 1 Dose into the skin every 14 (fourteen) days.  Marland Kitchen losartan (COZAAR) 100 MG tablet Take 1 tablet (100 mg total) by mouth daily.  Marland Kitchen omeprazole (PRILOSEC) 40 MG capsule TAKE 1 CAPSULE BY MOUTH EVERY DAY     Allergies:   Pravastatin, Amlodipine, and Zetia [ezetimibe]   Social History   Tobacco Use  . Smoking status: Never Smoker  . Smokeless tobacco: Never Used  Substance Use Topics  . Alcohol use: No  . Drug use: No     Family Hx: The patient's family history includes Cancer in his paternal grandmother; Heart attack in his maternal grandfather and paternal grandfather; Hypertension in his father, maternal grandfather, and paternal grandfather. There is no history of Colon cancer.  ROS:   Please see the history of present illness.    All other systems reviewed and are negative.  Prior CV studies:   The following studies were reviewed today:  Coronary CT-A 04/22/17: 1. Coronary calcium score of 0. This was  0 percentile for age and sex matched control. 2. Normal coronary origin with right dominance. 3. No evidence of CAD.  ETT 04/30/16:   Blood pressure demonstrated a hypertensive response to exercise.  There was no ST segment deviation noted during stress.  Normal exercise tolerance test at execellent work of 13.7 mets and adequate heart rate response.  Low risk study.  Labs/Other Tests and Data Reviewed:    EKG:  An ECG dated 08/12/20 was personally reviewed today and demonstrated:  sinus rhythm.  Rate 65 bpm.  Recent Labs: 04/21/2020: ALT 13; BUN 12; Creatinine, Ser  1.07; Potassium 4.7; Sodium 140   Recent Lipid Panel Lab Results  Component Value Date/Time   CHOL 106 04/21/2020 09:02 AM   TRIG 121 04/21/2020 09:02 AM   HDL 37 (L) 04/21/2020 09:02 AM   CHOLHDL 2.9 04/21/2020 09:02 AM   CHOLHDL 4.5 10/07/2016 09:41 AM   LDLCALC 47 04/21/2020 09:02 AM   07/27/16: Total cholesterol 242, triglycerides 161, HDL 39, LDL 171.  09/26/2018: Total cholesterol 250, triglycerides 176, HDL 40, LDL 175 09/27/2019: Total cholesterol 237, triglycerides 148, HDL 35, LDL 175   Wt Readings from Last 3 Encounters:  08/12/20 208 lb 9.6 oz (94.6 kg)  01/04/20 211 lb (95.7 kg)  11/14/19 210 lb (95.3 kg)     Objective:    VS:  BP 130/82   Pulse 65   Ht 5\' 7"  (1.702 m)   Wt 208 lb 9.6 oz (94.6 kg)   BMI 32.67 kg/m  , BMI Body mass index is 32.67 kg/m. GENERAL:  Well appearing HEENT: Pupils equal round and reactive, fundi not visualized, oral mucosa unremarkable NECK:  No jugular venous distention, waveform within normal limits, carotid upstroke brisk and symmetric, no bruits LUNGS:  Clear to auscultation bilaterally HEART:  RRR.  PMI not displaced or sustained,S1 and S2 within normal limits, no S3, no S4, no clicks, no rubs, no murmurs ABD:  Flat, positive bowel sounds normal in frequency in pitch, no bruits, no rebound, no guarding, no midline pulsatile mass, no hepatomegaly, no splenomegaly EXT:  2 plus pulses throughout, no edema, no cyanosis no clubbing SKIN:  No rashes no nodules NEURO:  Cranial nerves II through XII grossly intact, motor grossly intact throughout PSYCH:  Cognitively intact, oriented to person place and time   ASSESSMENT & PLAN:    # Hypertension: BP I was initially elevated but better on repeat.  Increase exercise and continue to monitor.  Continue to limit sodium intake.  # Chest pain: Mostly resolved with treatment of GERD.  Current symptoms are atypical and not consistent with exertion.  He had no coronary disease on CT-A.  #  Hyperlipidemia: Cholesterol levels were much better-controlled on pravastatin.  However, he developed severe rhabdomyolysis with exercise.  He did not tolerate Zetia.  He saw Dr. and Repatha was prescribed.  He is concerned about elevated glucose but willing to try.  Check lipids/CMP in 2 months.    Medication Adjustments/Labs and Tests Ordered: Current medicines are reviewed at length with the patient today.  Concerns regarding medicines are outlined above.   Tests Ordered: No orders of the defined types were placed in this encounter.   Medication Changes: No orders of the defined types were placed in this encounter.   Follow Up:  In Person in 1 year(s)  Signed, Rennis Golden, MD  08/12/2020 5:20 PM    [facility]

## 2020-09-04 ENCOUNTER — Other Ambulatory Visit: Payer: Self-pay | Admitting: Cardiovascular Disease

## 2021-01-02 ENCOUNTER — Other Ambulatory Visit: Payer: Self-pay | Admitting: Internal Medicine

## 2021-01-02 ENCOUNTER — Other Ambulatory Visit: Payer: Self-pay | Admitting: Cardiovascular Disease

## 2021-01-02 NOTE — Telephone Encounter (Signed)
*  STAT* If patient is at the pharmacy, call can be transferred to refill team.   1. Which medications need to be refilled? (please list name of each medication and dose if known) new prescription for  Losartan  2. Which pharmacy/location (including street and city if local pharmacy) is medication to be sent to? [facility] [employer] [employer]  3. Do they need a 30 day or 90 day supply? 90 days and refills

## 2021-03-12 ENCOUNTER — Other Ambulatory Visit: Payer: Self-pay | Admitting: Cardiovascular Disease

## 2021-05-06 ENCOUNTER — Telehealth: Payer: Self-pay | Admitting: Internal Medicine

## 2021-05-06 NOTE — Telephone Encounter (Signed)
PA for Repatha Sureclick submitted via CMM (Key: [id number])

## 2021-05-07 NOTE — Telephone Encounter (Signed)
Request Reference Number: [id number]. REPATHA SURE INJ 140MG /ML is approved through 05/06/2022

## 2021-09-09 ENCOUNTER — Other Ambulatory Visit: Payer: Self-pay | Admitting: Cardiovascular Disease

## 2022-01-07 ENCOUNTER — Telehealth (HOSPITAL_BASED_OUTPATIENT_CLINIC_OR_DEPARTMENT_OTHER): Payer: Self-pay | Admitting: Cardiovascular Disease

## 2022-01-07 ENCOUNTER — Other Ambulatory Visit: Payer: Self-pay | Admitting: Cardiovascular Disease

## 2022-01-07 NOTE — Telephone Encounter (Signed)
Left message for patient to call and schedule overdue follow up  with Dr. [place] / APP for medication refills 

## 2022-01-08 ENCOUNTER — Telehealth: Payer: Self-pay | Admitting: Cardiovascular Disease

## 2022-01-08 NOTE — Telephone Encounter (Signed)
°*  STAT* If patient is at the pharmacy, call can be transferred to refill team.   1. Which medications need to be refilled? (please list name of each medication and dose if known) losartan (COZAAR) 100 MG tablet    2. Which pharmacy/location (including street and city if local pharmacy) is medication to be sent to?[facility] - [place], [place] - [address]   3. Do they need a 30 day or 90 day supply?  90 day  Patient is out of medication  

## 2022-01-26 ENCOUNTER — Other Ambulatory Visit: Payer: Self-pay | Admitting: Physician Assistant

## 2022-01-26 DIAGNOSIS — R159 Full incontinence of feces: Secondary | ICD-10-CM

## 2022-02-02 ENCOUNTER — Other Ambulatory Visit: Payer: Self-pay

## 2022-02-02 ENCOUNTER — Ambulatory Visit
Admission: RE | Admit: 2022-02-02 | Discharge: 2022-02-02 | Disposition: A | Discharge status: Home / Self Care | Payer: 59 | Source: Ambulatory Visit | Attending: Physician Assistant | Admitting: Physician Assistant

## 2022-02-02 DIAGNOSIS — R159 Full incontinence of feces: Secondary | ICD-10-CM

## 2022-02-02 MED ORDER — GADOBENATE DIMEGLUMINE 529 MG/ML IV SOLN
20.0000 mL | Freq: Once | INTRAVENOUS | Status: AC | PRN
  Administered 2022-02-02: 20 mL via INTRAVENOUS

## 2022-03-29 NOTE — Progress Notes (Signed)
? ?Cardiology Office Note  ? ?Date:  03/30/2022  ? ?ID:  Craig Powell, DOB 1981/10/02, MRN [id number] ? ?PCP:  Cyndi Bender, PA-C  ?Cardiologist:  Skeet Latch, MD  ?Electrophysiologist:  None  ? ?Evaluation Performed:  Follow-Up Visit ? ?Chief Complaint:  Hyperlipidemia, hypertension ? ?History of Present Illness:   ? ?Craig Powell is a 41 y.o. male with hypertension, hyperlipidemia and GERD who presents for follow-up.  He was diagnosed with hypertension in college. He started on losartan 04/20/16 and the dose was increased 07/2016.  He was also started on pravastatin due to hyperlipidemia his concern about statins worsening his pre-diabetes.  [name] reported atypical chest was and was referred for ETT on 04/30/16 that was negative for ischemia.  He developed rhabdomyolysis with a CPK greater than 50,000. He was hospitalized and had acute renal insufficiency. His creatinine normalized by discharge. His blood pressures remained elevated so amlodipine was added to his regimen but stopped due to edema.  HCTZ was added and losartan was increased.  He had atypical chest pain that improved with treatment for GERD.  Coronary CT-A on 03/2017 showed no coronary disease and had a calcium score of 0. ?  ?Mr. Hashem was started on Zetia.  However he stopped it due to diarrhea.  He was referred to Dr. Debara Pickett and started on [place].  He noted dizziness with exertion.  There was concern it may be due to elevated BP. He started HCTZ 6.21m. ? ?At his last appointment, HCTZ was discontinued due to dizziness. His blood pressure was initially elevated but improved on repeat. Today, he is doing well. 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 Typically, his blood pressure at home is around 140/80. He was told [facility] would no longer cover his prescriptions and he has not been able to take RAbrams However, he notices muscle cramps with Repatha. He is busy at home taking care of his 2 young children and does not exercise  regularly. Interestingly, he delivered his daughter at home. He denies any palpitations, chest pain, or shortness of breath, lightheadedness, headaches, syncope, orthopnea, PND, lower extremity edema or exertional symptoms. ? ?Past Medical History:  ?Diagnosis Date  ? Essential hypertension 04/22/2016  ? GERD (gastroesophageal reflux disease) 11/24/2017  ? Hyperlipidemia   ? Hypertension   ? Obesity (BMI 30.0-34.9) 04/22/2016  ? ?Past Surgical History:  ?Procedure Laterality Date  ? HERNIA REPAIR    ? umbilical, and right groin  ?  ? ?Current Meds  ?Medication Sig  ? [DISCONTINUED] losartan (COZAAR) 100 MG tablet Take 1 tablet (100 mg total) by mouth daily. PLEASE CONTACT THE OFFICE FOR ADDITIONAL REFILLS  ? [DISCONTINUED] omeprazole (PRILOSEC) 40 MG capsule TAKE 1 CAPSULE BY MOUTH EVERY DAY  ?  ? ?Allergies:   Pravastatin, Amlodipine, and Zetia [ezetimibe]  ? ?Social History  ? ?Tobacco Use  ? Smoking status: Never  ? Smokeless tobacco: Never  ?Substance Use Topics  ? Alcohol use: No  ? Drug use: No  ?  ? ?Family Hx: ?The patient's family history includes Cancer in his paternal grandmother; Heart attack in his maternal grandfather and paternal grandfather; Hypertension in his father, maternal grandfather, and paternal grandfather. There is no history of Colon cancer. ? ?ROS:   ?Please see the history of present illness.    ?All other systems reviewed and are negative. ? ?Prior CV studies:   ?The following studies were reviewed today: ? ?Coronary CT-A 04/22/17: ?1. Coronary calcium score of 0. This was 0 percentile  for age and sex matched control. ?2. Normal coronary origin with right dominance. ?3. No evidence of CAD. ? ?ETT 04/30/16:  ?Blood pressure demonstrated a hypertensive response to exercise. ?There was no ST segment deviation noted during stress. ?Normal exercise tolerance test at execellent work of 13.7 mets and adequate heart rate response. ?Low risk study. ? ?Labs/Other Tests and Data Reviewed:   ? ?EKG:    ?03/30/22: Sinus rhythm, rate 63 bpm ?08/12/20: sinus rhythm rate 65 bpm ? ?Recent Labs: ?No results found for requested labs within last 8760 hours.  ? ?Recent Lipid Panel ?Lab Results  ?Component Value Date/Time  ? CHOL 106 04/21/2020 09:02 AM  ? TRIG 121 04/21/2020 09:02 AM  ? HDL 37 (L) 04/21/2020 09:02 AM  ? CHOLHDL 2.9 04/21/2020 09:02 AM  ? CHOLHDL 4.5 10/07/2016 09:41 AM  ? LDLCALC 47 04/21/2020 09:02 AM  ? ?07/27/16: Total cholesterol 242, triglycerides 161, HDL 39, LDL 171.  ?09/26/2018: ?Total cholesterol 250, triglycerides 176, HDL 40, LDL 175 ?09/27/2019: ?Total cholesterol 237, triglycerides 148, HDL 35, LDL 175 ?  ? ?Wt Readings from Last 3 Encounters:  ?03/30/22 212 lb (96.2 kg)  ?08/12/20 208 lb 9.6 oz (94.6 kg)  ?01/04/20 211 lb (95.7 kg)  ?  ? ?Objective:   ? ?VS:  BP (!) 146/92 (BP Location: Right Arm, Patient Position: Sitting, Cuff Size: Large)   Pulse 63   Ht '5\' 7"'  (1.702 m)   Wt 212 lb (96.2 kg)   BMI 33.20 kg/m?  , BMI Body mass index is 33.2 kg/m?. ?GENERAL:  Well appearing ?HEENT: Pupils equal round and reactive, fundi not visualized, oral mucosa unremarkable ?NECK:  No jugular venous distention, waveform within normal limits, carotid upstroke brisk and symmetric, no bruits ?LUNGS:  Clear to auscultation bilaterally ?HEART:  RRR.  PMI not displaced or sustained,S1 and S2 within normal limits, no S3, no S4, no clicks, no rubs, no murmurs ?ABD:  Flat, positive bowel sounds normal in frequency in pitch, no bruits, no rebound, no guarding, no midline pulsatile mass, no hepatomegaly, no splenomegaly ?EXT:  2 plus pulses throughout, no edema, no cyanosis no clubbing ?SKIN:  No rashes no nodules ?NEURO:  Cranial nerves II through XII grossly intact, motor grossly intact throughout ?PSYCH:  Cognitively intact, oriented to person place and time ? ?ASSESSMENT & PLAN:   ?Essential hypertension ?Blood pressure is elevated today.  However it has been well controlled at home.  He has not been taking his  losartan for the last 4 days because he ran out.  He is going to pick it up at the pharmacy today.  Keep checking blood pressure at home.  He will call us if it starts running over 130/80.  Encouraged him to increase his exercise. ? ?GERD (gastroesophageal reflux disease) ?Refill omeprazole.  Encouraged weight loss as above. ? ?Obesity (BMI 30.0-34.9) ?Encouraged him to increase his exercise.  Check a hemoglobin A1c. ? ?Hyperlipidemia ?Lipids have been poorly controlled.  He developed rhabdomyolysis on statins.  His lipids improved significantly on Repatha.  He has been out of it for the last 6 months.  He noticed that when he was taking it he got really bad cramps.  He is going to go ahead and refill the Repatha for now.  Ideally, we would like to transition him to Praluent if his insurance will cover it.  We will check into this.  Check fasting lipids and a CMP today to reestablish a baseline. ? ? ? ?Medication Adjustments/Labs and Tests  Ordered: ?Current medicines are reviewed at length with the patient today.  Concerns regarding medicines are outlined above.  ? ?Tests Ordered: ?Orders Placed This Encounter  ?Procedures  ? Lipid panel  ? Comp Met (CMET)  ? HgB A1c  ? EKG 12-Lead  ? ? ?Medication Changes: ?Meds ordered this encounter  ?Medications  ? losartan (COZAAR) 100 MG tablet  ?  Sig: Take 1 tablet (100 mg total) by mouth daily.  ?  Dispense:  90 tablet  ?  Refill:  3  ? Evolocumab (REPATHA SURECLICK) 682 MG/ML SOAJ  ?  Sig: Inject 1 Dose into the skin every 14 (fourteen) days.  ?  Dispense:  2 mL  ?  Refill:  3  ? omeprazole (PRILOSEC) 40 MG capsule  ?  Sig: Take 1 capsule (40 mg total) by mouth daily.  ?  Dispense:  90 capsule  ?  Refill:  3  ? ? ?Disposition: FU with Jamarie Joplin C. Oval Linsey, MD, [facility] in 1 year ? ?I,Mykaella Javier,acting as a scribe for Skeet Latch, MD.,have documented all relevant documentation on the behalf of Skeet Latch, MD,as directed by  Skeet Latch, MD while in the  presence of Skeet Latch, MD. ? ?I, Forest Oval Linsey, MD have reviewed all documentation for this visit.  The documentation of the exam, diagnosis, procedures, and orders on 03/30/2022 are all accurate and comp

## 2022-03-30 ENCOUNTER — Ambulatory Visit (HOSPITAL_BASED_OUTPATIENT_CLINIC_OR_DEPARTMENT_OTHER): Payer: 59 | Admitting: Cardiovascular Disease

## 2022-03-30 ENCOUNTER — Encounter (HOSPITAL_BASED_OUTPATIENT_CLINIC_OR_DEPARTMENT_OTHER): Payer: Self-pay | Admitting: Cardiovascular Disease

## 2022-03-30 VITALS — BP 146/92 | HR 63 | Ht 67.0 in | Wt 212.0 lb

## 2022-03-30 DIAGNOSIS — E7800 Pure hypercholesterolemia, unspecified: Secondary | ICD-10-CM | POA: Diagnosis not present

## 2022-03-30 DIAGNOSIS — K219 Gastro-esophageal reflux disease without esophagitis: Secondary | ICD-10-CM | POA: Diagnosis not present

## 2022-03-30 DIAGNOSIS — Z79899 Other long term (current) drug therapy: Secondary | ICD-10-CM

## 2022-03-30 DIAGNOSIS — E669 Obesity, unspecified: Secondary | ICD-10-CM

## 2022-03-30 DIAGNOSIS — I10 Essential (primary) hypertension: Secondary | ICD-10-CM | POA: Diagnosis not present

## 2022-03-30 MED ORDER — OMEPRAZOLE 40 MG PO CPDR: 40.0000 mg | DELAYED_RELEASE_CAPSULE | Freq: Every day | ORAL | 3 refills | Status: DC

## 2022-03-30 MED ORDER — REPATHA SURECLICK 140 MG/ML ~~LOC~~ SOAJ: 1.0000 | SUBCUTANEOUS | 3 refills | Status: DC

## 2022-03-30 MED ORDER — LOSARTAN POTASSIUM 100 MG PO TABS: 100.0000 mg | ORAL_TABLET | Freq: Every day | ORAL | 3 refills | Status: DC

## 2022-03-30 NOTE — Assessment & Plan Note (Signed)
Lipids have been poorly controlled.  He developed rhabdomyolysis on statins.  His lipids improved significantly on Repatha.  He has been out of it for the last 6 months.  He noticed that when he was taking it he got really bad cramps.  He is going to go ahead and refill the Repatha for now.  Ideally, we would like to transition him to Praluent if his insurance will cover it.  We will check into this.  Check fasting lipids and a CMP today to reestablish a baseline. ?

## 2022-03-30 NOTE — Assessment & Plan Note (Signed)
Encouraged him to increase his exercise.  Check a hemoglobin A1c. ?

## 2022-03-30 NOTE — Assessment & Plan Note (Signed)
Refill omeprazole.  Encouraged weight loss as above. ?

## 2022-03-30 NOTE — Assessment & Plan Note (Signed)
Blood pressure is elevated today.  However it has been well controlled at home.  He has not been taking his losartan for the last 4 days because he ran out.  He is going to pick it up at the pharmacy today.  Keep checking blood pressure at home.  He will call us if it starts running over 130/80.  Encouraged him to increase his exercise. ?

## 2022-03-30 NOTE — Patient Instructions (Signed)
Medication Instructions:  ?Continue current medications ? ?*If you need a refill on your cardiac medications before your next appointment, please call your pharmacy* ? ? ?Lab Work: ?Fasting Lipid, CMP and A1c Today ? ?If you have labs (blood work) drawn today and your tests are completely normal, you will receive your results only by: ?MyChart Message (if you have MyChart) OR ?A paper copy in the mail ?If you have any lab test that is abnormal or we need to change your treatment, we will call you to review the results. ? ? ?Testing/Procedures: ?None Ordered ? ? ?Follow-Up: ?At [facility], you and your health needs are our priority.  As part of our continuing mission to provide you with exceptional heart care, we have created designated Provider Care Teams.  These Care Teams include your primary Cardiologist (physician) and Advanced Practice Providers (APPs -  Physician Assistants and Nurse Practitioners) who all work together to provide you with the care you need, when you need it. ? ?We recommend signing up for the patient portal called "MyChart".  Sign up information is provided on this After Visit Summary.  MyChart is used to connect with patients for Virtual Visits (Telemedicine).  Patients are able to view lab/test results, encounter notes, upcoming appointments, etc.  Non-urgent messages can be sent to your provider as well.   ?To learn more about what you can do with MyChart, go to [email or web address].   ? ?Your next appointment:   ?1 year(s) ? ?The format for your next appointment:   ?In Person ? ?Provider:   ?Skeet Latch, MD  ? ? ? ? ?

## 2022-03-31 LAB — COMPREHENSIVE METABOLIC PANEL
ALT: 20 IU/L (ref 0–44)
AST: 12 IU/L (ref 0–40)
Albumin/Globulin Ratio: 2 (ref 1.2–2.2)
Albumin: 4.8 g/dL (ref 4.0–5.0)
Alkaline Phosphatase: 61 IU/L (ref 44–121)
BUN/Creatinine Ratio: 10 (ref 9–20)
BUN: 12 mg/dL (ref 6–20)
Bilirubin Total: 0.4 mg/dL (ref 0.0–1.2)
CO2: 24 mmol/L (ref 20–29)
Calcium: 10.3 mg/dL — ABNORMAL HIGH (ref 8.7–10.2)
Chloride: 99 mmol/L (ref 96–106)
Creatinine, Ser: 1.18 mg/dL (ref 0.76–1.27)
Globulin, Total: 2.4 g/dL (ref 1.5–4.5)
Glucose: 101 mg/dL — ABNORMAL HIGH (ref 70–99)
Potassium: 4.9 mmol/L (ref 3.5–5.2)
Sodium: 140 mmol/L (ref 134–144)
Total Protein: 7.2 g/dL (ref 6.0–8.5)
eGFR: 81 mL/min/{1.73_m2} (ref >59)

## 2022-03-31 LAB — LIPID PANEL
Chol/HDL Ratio: 6.4 ratio — ABNORMAL HIGH (ref 0.0–5.0)
Cholesterol, Total: 257 mg/dL — ABNORMAL HIGH (ref 100–199)
HDL: 40 mg/dL (ref >39)
LDL Chol Calc (NIH): 188 mg/dL — ABNORMAL HIGH (ref 0–99)
Triglycerides: 158 mg/dL — ABNORMAL HIGH (ref 0–149)
VLDL Cholesterol Cal: 29 mg/dL (ref 5–40)

## 2022-03-31 LAB — HEMOGLOBIN A1C
Est. average glucose Bld gHb Est-mCnc: 131 mg/dL
Hgb A1c MFr Bld: 6.2 % — ABNORMAL HIGH (ref 4.8–5.6)

## 2022-04-07 ENCOUNTER — Telehealth: Payer: Self-pay | Admitting: Internal Medicine

## 2022-04-07 NOTE — Telephone Encounter (Signed)
PA for repatha submitted via CMM ?(Key: [id number]) ? ?Dx: HLD, h/o rhabdomyolysis on statins ?

## 2022-04-07 NOTE — Telephone Encounter (Signed)
Request Reference Number: [id number]. REPATHA SURE INJ 140MG /ML is approved through 04/08/2023 ?

## 2023-02-16 ENCOUNTER — Telehealth: Payer: Self-pay | Admitting: Cardiovascular Disease

## 2023-02-16 DIAGNOSIS — I517 Cardiomegaly: Secondary | ICD-10-CM

## 2023-02-16 NOTE — Telephone Encounter (Signed)
Chart reviewed there is nothing from PCP in the chart. Records requested from PCP office. Will have Dr. Oval Linsey review records upon receipt.

## 2023-02-16 NOTE — Telephone Encounter (Signed)
Pt states his PCP did an x ray and said his heart was enlarged. He said the PCP office would be reaching out to Dr. Oval Linsey and he wanted to know if she had heard anything yet.

## 2023-02-18 NOTE — Telephone Encounter (Signed)
Records received, placed with Dr Blenda Mounts records to review

## 2023-02-24 NOTE — Telephone Encounter (Signed)
Mother's calling for update on the results.

## 2023-02-24 NOTE — Telephone Encounter (Signed)
Here ya go!

## 2023-02-24 NOTE — Telephone Encounter (Signed)
Spoke with mother, ok per DPR  Advised Dr Oval Linsey has been working at the hospital this week but is with her things for review when she returns

## 2023-03-17 NOTE — Telephone Encounter (Signed)
Dr Oval Linsey reviewed information and will obtain Echo  Mild cardiomegaly seen on chest xray, Dr Oval Linsey recommending Echo scheduled

## 2023-03-17 NOTE — Addendum Note (Signed)
Addended by: Alvina Filbert B on: 03/17/2023 04:09 PM   Modules accepted: Orders

## 2023-03-18 ENCOUNTER — Encounter (HOSPITAL_BASED_OUTPATIENT_CLINIC_OR_DEPARTMENT_OTHER): Payer: Self-pay

## 2023-04-11 ENCOUNTER — Other Ambulatory Visit (HOSPITAL_COMMUNITY): Payer: Self-pay

## 2023-04-11 ENCOUNTER — Telehealth: Payer: Self-pay | Admitting: Cardiovascular Disease

## 2023-04-11 MED ORDER — OMEPRAZOLE 40 MG PO CPDR: 40.0000 mg | DELAYED_RELEASE_CAPSULE | Freq: Every day | ORAL | 0 refills | Status: DC

## 2023-04-11 MED ORDER — LOSARTAN POTASSIUM 100 MG PO TABS: 100.0000 mg | ORAL_TABLET | Freq: Every day | ORAL | 1 refills | Status: DC

## 2023-04-11 NOTE — Telephone Encounter (Signed)
*  STAT* If patient is at the pharmacy, call can be transferred to refill team.   1. Which medications need to be refilled? (please list name of each medication and dose if known)   losartan (COZAAR) 100 MG tablet Take 1 tablet (100 mg total) by mouth daily.   omeprazole (PRILOSEC) 40 MG capsule Take 1 capsule (40 mg total) by mouth daily.    2. Which pharmacy/location (including street and city if local pharmacy) is medication to be sent to? [id number] - [place], [place] - [address]    3. Do they need a 30 day or 90 day supply? 7 day supply  Pt is scheduled for f/u 4/23, he has ran out of meds, does not have enough to last

## 2023-04-13 ENCOUNTER — Ambulatory Visit (INDEPENDENT_AMBULATORY_CARE_PROVIDER_SITE_OTHER): Payer: 59

## 2023-04-13 DIAGNOSIS — I517 Cardiomegaly: Secondary | ICD-10-CM | POA: Diagnosis not present

## 2023-04-13 LAB — ECHOCARDIOGRAM COMPLETE
Area-P 1/2: 4.31 cm2
MV M vel: 2.1 m/s
MV Peak grad: 17.6 mmHg
S' Lateral: 3.63 cm

## 2023-04-19 ENCOUNTER — Encounter (HOSPITAL_BASED_OUTPATIENT_CLINIC_OR_DEPARTMENT_OTHER): Payer: Self-pay | Admitting: Family

## 2023-04-19 ENCOUNTER — Ambulatory Visit (HOSPITAL_BASED_OUTPATIENT_CLINIC_OR_DEPARTMENT_OTHER): Payer: 59 | Admitting: Family

## 2023-04-19 VITALS — BP 112/66 | HR 65 | Ht 67.0 in | Wt 213.0 lb

## 2023-04-19 DIAGNOSIS — I10 Essential (primary) hypertension: Secondary | ICD-10-CM | POA: Diagnosis not present

## 2023-04-19 DIAGNOSIS — R072 Precordial pain: Secondary | ICD-10-CM

## 2023-04-19 DIAGNOSIS — E7800 Pure hypercholesterolemia, unspecified: Secondary | ICD-10-CM

## 2023-04-19 NOTE — Progress Notes (Unsigned)
Office Visit    Patient Name: Craig Powell Date of Encounter: 04/19/2023  PCP:  Arlyss Queen   [facility]  Cardiologist:  Chilton Si, MD  Advanced Practice Provider:  No care team member to display Electrophysiologist:  None      Chief Complaint    Craig Powell is a 42 y.o. male presents today for follow-up after echocardiogram  Past Medical History    Past Medical History:  Diagnosis Date   Essential hypertension 04/22/2016   GERD (gastroesophageal reflux disease) 11/24/2017   Hyperlipidemia    Hypertension    Obesity (BMI 30.0-34.9) 04/22/2016   Past Surgical History:  Procedure Laterality Date   HERNIA REPAIR     umbilical, and right groin    Allergies  Allergies  Allergen Reactions   Pravastatin     Rhabdomyolysis   Amlodipine     SWELLING    Zetia [Ezetimibe] Diarrhea    History of Present Illness    Craig Powell is a 42 y.o. male with a hx of OSA, hyperlipidemia, hypertension, GERD last seen 03/30/2022.  Diagnosed with hypertension in college.  Due to atypical chest pain underwent ETT 5/5-17 negative for ischemia.  Developed abnormalities with a CPA greater than 50,000 while on pravastatin.  He was hospitalized with AKI and creatinine normalized by discharge.  Amlodipine later added to regimen of losartan but discontinued due to edema.  HCTZ added and losartan increased.  Coronary CTA 03/2017 coronary calcium score of 0.  Zetia initiated but stopped due to diarrhea.  Referred to Dr. Rennis Golden and started on Repatha which had to be subsequently stopped due to leg cramps.  HCTZ later discontinued due to dizziness.  Recent CXR revealed enlarged heart. Subsequent echo 04/13/23 normal LVEF 55 to 60%, LV global hypokinesis, normal diastolic parameters, RV mildly enlarged, no significant valvular abnormalities.Craig Powell today for follow-up independently.  Enjoys spending time with his wife, 34-year-old daughter, 72-year-old  son.  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  Tells me he had double pneumonia in December. Subsequent CXR showed enlarged heart. Around time of pneumonia was having chest pain described as "dull ache". However, has since improved. Still discomfort with cough or sneeze. He has started back doing yard work. Pushing mower with no exertional dyspnea nor chest pain. Feel slightly lightheaded initially which improves as he continues.  No near-syncope, syncope. Reviewed echoresults.   Took repatha for 1-2 years, stopped, resumed and had leg cramping particularly in thighs and cramps.  Willing to trial Praluent.  Has not had cholesterol checked in greater than 1 year.  BP at home checked intermittently has been good goal less than 130/80.  Does note he had to "weird "events one in November and one in December.  While sleeping woke up chest pain, could not breath, felt as if he might die. Threw up both times. Happened in November and December.  Has since had sleep study with PCP diagnosis of severe sleep apnea and is awaiting delivery of CPAP.  EKGs/Labs/Other Studies Reviewed:   The following studies were reviewed today: Cardiac Studies & Procedures     STRESS TESTS  EXERCISE TOLERANCE TEST (ETT) 05/02/2016  Narrative  Blood pressure demonstrated a hypertensive response to exercise.  There was no ST segment deviation noted during stress.  Normal exercise tolerance test at execellent work of 13.7 mets and adequate heart rate response.  Lowr risk study.   ECHOCARDIOGRAM  ECHOCARDIOGRAM COMPLETE 04/13/2023  Narrative ECHOCARDIOGRAM REPORT  Patient Name:   Craig Powell Date of Exam: 04/13/2023 Medical Rec #:  [id number]       Height:       67.0 in Accession #:    [id number]      Weight:       212.0 lb Date of Birth:  30-Jul-1981       BSA:          2.073 m Patient Age:    41 years        BP:           130/75 mmHg Patient Gender: M               HR:           73 bpm. Exam Location:   Outpatient  Procedure: 3D Echo, 2D Echo, Cardiac Doppler, Color Doppler and Strain Analysis  Indications:    Cardiomegaly  History:        Patient has no prior history of Echocardiogram examinations. Risk Factors:Hypertension, Dyslipidemia and Non-Smoker. GERD.  Sonographer:    Jeryl Columbia RDCS Referring Phys: [id number] TIFFANY [place]  IMPRESSIONS   1. Left ventricular ejection fraction, by estimation, is 55 to 60%. Left ventricular ejection fraction by 3D volume is 58 %. The left ventricle has normal function. The left ventricle demonstrates global hypokinesis. Left ventricular diastolic parameters were normal. The average left ventricular global longitudinal strain is -16.9 %. The global longitudinal strain is normal. 2. Right ventricular systolic function is normal. The right ventricular size is mildly enlarged. 3. The mitral valve is normal in structure. No evidence of mitral valve regurgitation. No evidence of mitral stenosis. 4. The aortic valve is normal in structure. Aortic valve regurgitation is not visualized. No aortic stenosis is present. 5. The inferior vena cava is normal in size with greater than 50% respiratory variability, suggesting right atrial pressure of 3 mmHg.  FINDINGS Left Ventricle: Left ventricular ejection fraction, by estimation, is 55 to 60%. Left ventricular ejection fraction by 3D volume is 58 %. The left ventricle has normal function. The left ventricle demonstrates global hypokinesis. The average left ventricular global longitudinal strain is -16.9 %. The global longitudinal strain is normal. The left ventricular internal cavity size was normal in size. There is no left ventricular hypertrophy. Left ventricular diastolic parameters were normal.  Right Ventricle: The right ventricular size is mildly enlarged. No increase in right ventricular wall thickness. Right ventricular systolic function is normal.  Left Atrium: Left atrial size was normal in  size.  Right Atrium: Right atrial size was normal in size.  Pericardium: There is no evidence of pericardial effusion.  Mitral Valve: The mitral valve is normal in structure. No evidence of mitral valve regurgitation. No evidence of mitral valve stenosis.  Tricuspid Valve: The tricuspid valve is normal in structure. Tricuspid valve regurgitation is not demonstrated. No evidence of tricuspid stenosis.  Aortic Valve: The aortic valve is normal in structure. Aortic valve regurgitation is not visualized. No aortic stenosis is present.  Pulmonic Valve: The pulmonic valve was normal in structure. Pulmonic valve regurgitation is not visualized. No evidence of pulmonic stenosis.  Aorta: The aortic root is normal in size and structure.  Venous: The inferior vena cava is normal in size with greater than 50% respiratory variability, suggesting right atrial pressure of 3 mmHg.  IAS/Shunts: No atrial level shunt detected by color flow Doppler.   LEFT VENTRICLE PLAX 2D LVIDd:         4.90 cm  Diastology LVIDs:         3.63 cm         LV e' medial:    8.92 cm/s LV PW:         1.09 cm         LV E/e' medial:  6.8 LV IVS:        1.01 cm         LV e' lateral:   12.90 cm/s LVOT diam:     2.00 cm         LV E/e' lateral: 4.7 LV SV:         47 LV SV Index:   23              2D LVOT Area:     3.14 cm        Longitudinal Strain 2D Strain GLS  -16.9 % Avg:  3D Volume EF LV 3D EF:    Left ventricul ar ejection fraction by 3D volume is 58 %.  3D Volume EF: 3D EF:        58 % LV EDV:       128 ml LV ESV:       53 ml LV SV:        74 ml  RIGHT VENTRICLE RV Basal diam:  4.63 cm RV Mid diam:    3.47 cm RV S prime:     12.80 cm/s TAPSE (M-mode): 1.9 cm  LEFT ATRIUM           Index        RIGHT ATRIUM          Index LA diam:      3.50 cm 1.69 cm/m   RA Area:     9.79 cm LA Vol (A2C): 33.8 ml 16.31 ml/m  RA Volume:   18.60 ml 8.97 ml/m LA Vol (A4C): 19.0 ml 9.17 ml/m AORTIC  VALVE LVOT Vmax:   82.30 cm/s LVOT Vmean:  52.900 cm/s LVOT VTI:    0.150 m  AORTA Ao Root diam: 3.00 cm Ao Asc diam:  3.10 cm  MITRAL VALVE               TRICUSPID VALVE MV Area (PHT): 4.31 cm    TR Peak grad:   19.4 mmHg MV Decel Time: 176 msec    TR Vmax:        220.00 cm/s MR Peak grad: 17.6 mmHg MR Vmax:      210.00 cm/s  SHUNTS MV E velocity: 60.30 cm/s  Systemic VTI:  0.15 m MV A velocity: 61.30 cm/s  Systemic Diam: 2.00 cm MV E/A ratio:  0.98  Donato Schultz MD Electronically signed by Donato Schultz MD Signature Date/Time: 04/13/2023/2:55:20 PM    Final     CT SCANS  CT CORONARY MORPH W/CTA COR W/SCORE 04/23/2017  Addendum 04/23/2017  5:13 PM ADDENDUM REPORT: 04/23/2017 17:10  CLINICAL DATA:  81 -year-old male with atypical chest pain and family h/o premature CAD.  EXAM: Cardiac/Coronary  CT  TECHNIQUE: The patient was scanned on a Philips 256 scanner.  FINDINGS: A 120 kV prospective scan was triggered in the descending thoracic aorta at 111 HU's. Axial non-contrast 3 mm slices were carried out through the heart. The data set was analyzed on a dedicated work station and scored using the Agatson method. Gantry rotation speed was 270 msecs and collimation was .9 mm. 5 mg of iv Metoprolol and 0.8 mg of sl NTG was given. The  3D data set was reconstructed in 5% intervals of the 67-82 % of the R-R cycle. Diastolic phases were analyzed on a dedicated work station using MPR, MIP and VRT modes. The patient received 80 cc of contrast.  Aorta:  Normal size.  No calcifications.  No dissection.  Aortic Valve:  Trileaflet.  No calcifications.  Coronary Arteries:  Normal coronary origin.  Right dominance.  RCA is a large dominant artery that gives rise to PDA and PLVB. There is no plaque.  Left main is a large artery that gives rise to LAD and LCX arteries.  LAD is a large vessel that gives rise to one diagonal branch and has no plaque.  LCX is a non-dominant  artery that gives rise to one large OM1 branch. There is no plaque.  Other findings:  Normal pulmonary vein drainage into the left atrium.  Normal let atrial appendage without a thrombus.  IMPRESSION: 1. Coronary calcium score of 0. This was 0 percentile for age and sex matched control.  2. Normal coronary origin with right dominance.  3. No evidence of CAD.  Tobias Alexander   Electronically Signed By: Tobias Alexander On: 04/23/2017 17:10  Narrative EXAM: OVER-READ INTERPRETATION  CT CHEST  The following report is an over-read performed by radiologist Dr. Royal Piedra [facility] on 04/22/2017. This over-read does not include interpretation of cardiac or coronary anatomy or pathology. The coronary calcium score/coronary CTA interpretation by the cardiologist is attached.  COMPARISON:  None.  FINDINGS: Within the visualized portions of the thorax there are no suspicious appearing pulmonary nodules or masses, there is no acute consolidative airspace disease, no pleural effusions, no pneumothorax and no lymphadenopathy. Visualized portions of the upper abdomen are unremarkable. There are no aggressive appearing lytic or blastic lesions noted in the visualized portions of the skeleton.  IMPRESSION: 1. No significant incidental noncardiac findings are noted.  Electronically Signed: By: Trudie Reed M.D. On: 04/22/2017 16:07           EKG:  EKG is  ordered today.  The ekg ordered today demonstrates NSR 65 bpm with no acute ST/T wave changes.  Recent Labs: No results found for requested labs within last 365 days.  Recent Lipid Panel    Component Value Date/Time   CHOL 257 (H) 03/30/2022 1105   TRIG 158 (H) 03/30/2022 1105   HDL 40 03/30/2022 1105   CHOLHDL 6.4 (H) 03/30/2022 1105   CHOLHDL 4.5 10/07/2016 0941   VLDL 26 10/07/2016 0941   LDLCALC 188 (H) 03/30/2022 1105    Home Medications   Current Meds  Medication Sig   losartan  (COZAAR) 100 MG tablet Take 1 tablet (100 mg total) by mouth daily.   omeprazole (PRILOSEC) 40 MG capsule Take 1 capsule (40 mg total) by mouth daily.     Review of Systems      All other systems reviewed and are otherwise negative except as noted above.  Physical Exam    VS:  BP 112/66   Pulse 65   Ht  (1.702 m)   Wt 213 lb (96.6 kg)   BMI 33.36 kg/m  , BMI Body mass index is 33.36 kg/m.  Wt Readings from Last 3 Encounters:  04/19/23 213 lb (96.6 kg)  03/30/22 212 lb (96.2 kg)  08/12/20 208 lb 9.6 oz (94.6 kg)    GEN: Well nourished, well developed, in no acute distress. HEENT: normal. Neck: Supple, no JVD, carotid bruits, or masses. Cardiac: RRR, no murmurs,  rubs, or gallops. No clubbing, cyanosis, edema.  Radials/PT 2+ and equal bilaterally.  Respiratory:  Respirations regular and unlabored, clear to auscultation bilaterally. GI: Soft, nontender, nondistended. MS: No deformity or atrophy. Skin: Warm and dry, no rash. Neuro:  Strength and sensation are intact. Psych: Normal affect.  Assessment & Plan     Atypical chest pain - prior CT 03/2017 with coronary calcium score of 0. However given LV global hypokinesis and atypical chest pain, plan for coronary calcium score.   OSA -severe OSA by sleep study.  PCP has ordered CPAP and await delivery.  HTN - BP well controlled. Continue current antihypertensive regimen.    HLD - Statin intolerant with rhado. Did not tolerate Zetia due to diarrhea. Repatha previously caused cramps. Willing to try Praluent. Update Lp(a), lipid panel, CMP. Determine Praluent dosing based on labs.          Disposition: Follow up in 6 month(s) with Chilton Si, MD or APP.  Signed, Alver Sorrow, NP 04/19/2023, 11:21 AM  [facility]

## 2023-04-19 NOTE — Patient Instructions (Signed)
Medication Instructions:  Your physician recommends that you continue on your current medications as directed. Please refer to the Current Medication list given to you today.  Lab Work: Your physician recommends that you return for lab work in 1-2 weeks for Fasting Lpa, Lipid Panel and CMP at [employer] [facility]  If you have labs (blood work) drawn today and your tests are completely normal, you will receive your results only by: [employer] (if you have MyChart) OR A paper copy in the mail If you have any lab test that is abnormal or we need to change your treatment, we will call you to review the results.   Testing/Procedures: Your physician has recommend you to have a coronary calcium score. This is a self pay test that will cost $99  Follow-Up: At [facility], you and your health needs are our priority.  As part of our continuing mission to provide you with exceptional heart care, we have created designated Provider Care Teams.  These Care Teams include your primary Cardiologist (physician) and Advanced Practice Providers (APPs -  Physician Assistants and Nurse Practitioners) who all work together to provide you with the care you need, when you need it.  We recommend signing up for the patient portal called "MyChart".  Sign up information is provided on this After Visit Summary.  MyChart is used to connect with patients for Virtual Visits (Telemedicine).  Patients are able to view lab/test results, encounter notes, upcoming appointments, etc.  Non-urgent messages can be sent to your provider as well.   To learn more about what you can do with MyChart, go to [email or web address].    Your next appointment:   6 month(s)  Provider:   Chilton Si, MD or Gillian Shields, NP

## 2023-04-21 ENCOUNTER — Encounter (HOSPITAL_BASED_OUTPATIENT_CLINIC_OR_DEPARTMENT_OTHER): Payer: Self-pay | Admitting: Family

## 2023-04-24 LAB — COMPREHENSIVE METABOLIC PANEL
ALT: 33 IU/L (ref 0–44)
AST: 24 IU/L (ref 0–40)
Albumin/Globulin Ratio: 1.8 (ref 1.2–2.2)
Albumin: 4.2 g/dL (ref 4.1–5.1)
Alkaline Phosphatase: 60 IU/L (ref 44–121)
BUN/Creatinine Ratio: 10 (ref 9–20)
BUN: 11 mg/dL (ref 6–20)
Bilirubin Total: 0.3 mg/dL (ref 0.0–1.2)
CO2: 23 mmol/L (ref 20–29)
Calcium: 9.2 mg/dL (ref 8.7–10.2)
Chloride: 101 mmol/L (ref 96–106)
Creatinine, Ser: 1.15 mg/dL (ref 0.76–1.27)
Globulin, Total: 2.3 g/dL (ref 1.5–4.5)
Glucose: 107 mg/dL — ABNORMAL HIGH (ref 70–99)
Potassium: 4.3 mmol/L (ref 3.5–5.2)
Sodium: 140 mmol/L (ref 134–144)
Total Protein: 6.5 g/dL (ref 6.0–8.5)
eGFR: 83 mL/min/{1.73_m2} (ref >59)

## 2023-04-24 LAB — LIPOPROTEIN A (LPA): Lipoprotein (a): <8.4 nmol/L (ref <75.0)

## 2023-04-24 LAB — LIPID PANEL
Chol/HDL Ratio: 5.1 ratio — ABNORMAL HIGH (ref 0.0–5.0)
Cholesterol, Total: 142 mg/dL (ref 100–199)
HDL: 28 mg/dL — ABNORMAL LOW (ref >39)
LDL Chol Calc (NIH): 84 mg/dL (ref 0–99)
Triglycerides: 172 mg/dL — ABNORMAL HIGH (ref 0–149)
VLDL Cholesterol Cal: 30 mg/dL (ref 5–40)

## 2023-04-26 ENCOUNTER — Ambulatory Visit
Admission: RE | Admit: 2023-04-26 | Discharge: 2023-04-26 | Disposition: A | Discharge status: Home / Self Care | Payer: 59 | Source: Ambulatory Visit | Attending: Family | Admitting: Family

## 2023-04-26 DIAGNOSIS — R072 Precordial pain: Secondary | ICD-10-CM | POA: Insufficient documentation

## 2023-07-11 ENCOUNTER — Other Ambulatory Visit: Payer: Self-pay | Admitting: Cardiovascular Disease

## 2023-07-11 NOTE — Telephone Encounter (Signed)
Rx(s) sent to pharmacy electronically.  

## 2023-10-07 ENCOUNTER — Other Ambulatory Visit: Payer: Self-pay | Admitting: Cardiovascular Disease

## 2024-01-05 ENCOUNTER — Other Ambulatory Visit: Payer: Self-pay | Admitting: Family

## 2024-04-08 ENCOUNTER — Other Ambulatory Visit: Payer: Self-pay | Admitting: Family

## 2024-04-09 ENCOUNTER — Other Ambulatory Visit (HOSPITAL_BASED_OUTPATIENT_CLINIC_OR_DEPARTMENT_OTHER): Payer: Self-pay

## 2024-04-09 MED ORDER — OMEPRAZOLE 40 MG PO CPDR: 40.0000 mg | DELAYED_RELEASE_CAPSULE | Freq: Every day | ORAL | 0 refills | Status: DC

## 2024-04-16 ENCOUNTER — Encounter (HOSPITAL_BASED_OUTPATIENT_CLINIC_OR_DEPARTMENT_OTHER): Payer: Self-pay | Admitting: Family

## 2024-04-16 ENCOUNTER — Ambulatory Visit (INDEPENDENT_AMBULATORY_CARE_PROVIDER_SITE_OTHER): Admitting: Family

## 2024-04-16 VITALS — BP 120/80 | HR 64 | Ht 67.0 in

## 2024-04-16 DIAGNOSIS — I10 Essential (primary) hypertension: Secondary | ICD-10-CM

## 2024-04-16 DIAGNOSIS — K219 Gastro-esophageal reflux disease without esophagitis: Secondary | ICD-10-CM | POA: Diagnosis not present

## 2024-04-16 DIAGNOSIS — G4733 Obstructive sleep apnea (adult) (pediatric): Secondary | ICD-10-CM | POA: Diagnosis not present

## 2024-04-16 DIAGNOSIS — E782 Mixed hyperlipidemia: Secondary | ICD-10-CM

## 2024-04-16 MED ORDER — LOSARTAN POTASSIUM 100 MG PO TABS: 100.0000 mg | ORAL_TABLET | Freq: Every day | ORAL | 3 refills | Status: AC

## 2024-04-16 MED ORDER — OMEPRAZOLE 40 MG PO CPDR: 40.0000 mg | DELAYED_RELEASE_CAPSULE | Freq: Every day | ORAL | 3 refills | Status: AC

## 2024-04-16 NOTE — Progress Notes (Signed)
 Cardiology Office Note:  .   Date:  04/16/2024  ID:  Craig Powell, DOB 08-07-1981, MRN [id number] PCP: Aloha Arnold, PA-C  [facility] Providers Cardiologist:  Craig Sos, MD    History of Present Illness: .   Craig Powell is a 43 y.o. male  with a hx of OSA, hyperlipidemia, hypertension, GERD.   Diagnosed with hypertension in college.  Due to atypical chest pain underwent ETT 04/30/16 negative for ischemia.  Developed abnormalities with a CPK greater than 50,000 while on pravastatin .  He was hospitalized with AKI and creatinine normalized by discharge.  Amlodipine  later added to regimen of losartan  but discontinued due to edema.  HCTZ added and losartan  increased.  Coronary CTA 03/2017 coronary calcium score of 0.  Zetia  initiated but stopped due to diarrhea.  Referred to Dr. Maximo Powell and started on Repatha  which had to be subsequently stopped due to leg cramps.  HCTZ later discontinued due to dizziness.   Echo 04/13/23 normal LVEF 55 to 60%, LV global hypokinesis, normal diastolic parameters, RV mildly enlarged, no significant valvular abnormalities.  Last seen 04/19/2023.  BP elevated in clinic but well-controlled at home.  He had run out of losartan  and refill provided.  Repeat calcium score 04/26/2023 of 0.  4/26 of 24 LP(a) unremarkable at less than 8.4.  Lipid panel 04/22/2023 total cholesterol 142, triglycerides 172, HDL 28, LDL 84.  He was recommended for continued lifestyle interventions to further lower cholesterol. Praluent noted for future option if needed.     Discussed the use of AI scribe software for clinical note transcription with the patient, who gave verbal consent to proceed.  History of Present Illness Presents today for follow-up independently.  Enjoys finding time with his wife, 46-year-old daughter, 77-year-old son.  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  He does note some bilateral lower  extremity discomfort described as 'pressure and tingling.' The symptoms are constant, even during sleep, and are not associated with pain nor worse with ambulation.  He has been standing frequently due to work and a home remodel. He also has chronic plantar fasciitis.  Reassured atypical for claudication.  Since last seen started using a CPAP for sleep apnea and reports feeling 'dramatically better.' He has not had any cardiac symptoms. He has not been checking his blood pressure at home, but it has been 'pretty good' at recent doctor visits.  Reports no shortness of breath nor dyspnea on exertion. Reports no chest pain, pressure, or tightness. No  orthopnea, PND. Mild edema in bilateral LE by end of day if he is on his feet prolonged time. Reports no palpitations.    ROS: Please see the history of present illness.    All other systems reviewed and are negative.   Studies Reviewed: Craig Powell   EKG Interpretation Date/Time:  Monday April 16 2024 08:50:23 EDT Ventricular Rate:  64 PR Interval:  130 QRS Duration:  90 QT Interval:  368 QTC Calculation: 379 R Axis:   4  Text Interpretation: Normal sinus rhythm Normal ECG Confirmed by Craig Powell ([id number]) on 04/16/2024 8:57:54 AM    Cardiac Studies & Procedures   ______________________________________________________________________________________________   STRESS TESTS  EXERCISE TOLERANCE TEST (ETT) 04/30/2016  Narrative  Blood pressure demonstrated a hypertensive response to exercise.  There was no ST segment deviation noted during stress.  Normal exercise tolerance test at execellent work of 13.7 mets and adequate heart rate response.  Lowr  risk study.   ECHOCARDIOGRAM  ECHOCARDIOGRAM COMPLETE 04/13/2023  Narrative ECHOCARDIOGRAM REPORT    Patient Name:   Craig Powell Date of Exam: 04/13/2023 Medical Rec #:  [id number]       Height:       67.0 in Accession #:    [id number]      Weight:       212.0 lb Date of Birth:  [birth year]        BSA:          2.073 m Patient Age:    41 years        BP:           130/75 mmHg Patient Gender: M               HR:           73 bpm. Exam Location:  Outpatient  Procedure: 3D Echo, 2D Echo, Cardiac Doppler, Color Doppler and Strain Analysis  Indications:    Cardiomegaly  History:        Patient has no prior history of Echocardiogram examinations. Risk Factors:Hypertension, Dyslipidemia and Non-Smoker. GERD.  Sonographer:    Craig Powell RDCS Referring Phys: [id number] Craig Powell  IMPRESSIONS   1. Left ventricular ejection fraction, by estimation, is 55 to 60%. Left ventricular ejection fraction by 3D volume is 58 %. The left ventricle has normal function. The left ventricle demonstrates global hypokinesis. Left ventricular diastolic parameters were normal. The average left ventricular global longitudinal strain is -16.9 %. The global longitudinal strain is normal. 2. Right ventricular systolic function is normal. The right ventricular size is mildly enlarged. 3. The mitral valve is normal in structure. No evidence of mitral valve regurgitation. No evidence of mitral stenosis. 4. The aortic valve is normal in structure. Aortic valve regurgitation is not visualized. No aortic stenosis is present. 5. The inferior vena cava is normal in size with greater than 50% respiratory variability, suggesting right atrial pressure of 3 mmHg.  FINDINGS Left Ventricle: Left ventricular ejection fraction, by estimation, is 55 to 60%. Left ventricular ejection fraction by 3D volume is 58 %. The left ventricle has normal function. The left ventricle demonstrates global hypokinesis. The average left ventricular global longitudinal strain is -16.9 %. The global longitudinal strain is normal. The left ventricular internal cavity size was normal in size. There is no left ventricular hypertrophy. Left ventricular diastolic parameters were normal.  Right Ventricle: The right ventricular size is  mildly enlarged. No increase in right ventricular wall thickness. Right ventricular systolic function is normal.  Left Atrium: Left atrial size was normal in size.  Right Atrium: Right atrial size was normal in size.  Pericardium: There is no evidence of pericardial effusion.  Mitral Valve: The mitral valve is normal in structure. No evidence of mitral valve regurgitation. No evidence of mitral valve stenosis.  Tricuspid Valve: The tricuspid valve is normal in structure. Tricuspid valve regurgitation is not demonstrated. No evidence of tricuspid stenosis.  Aortic Valve: The aortic valve is normal in structure. Aortic valve regurgitation is not visualized. No aortic stenosis is present.  Pulmonic Valve: The pulmonic valve was normal in structure. Pulmonic valve regurgitation is not visualized. No evidence of pulmonic stenosis.  Aorta: The aortic root is normal in size and structure.  Venous: The inferior vena cava is normal in size with greater than 50% respiratory variability, suggesting right atrial pressure of 3 mmHg.  IAS/Shunts: No atrial level shunt detected by color flow Doppler.   LEFT VENTRICLE  PLAX 2D LVIDd:         4.90 cm         Diastology LVIDs:         3.63 cm         LV e' medial:    8.92 cm/s LV PW:         1.09 cm         LV E/e' medial:  6.8 LV IVS:        1.01 cm         LV e' lateral:   12.90 cm/s LVOT diam:     2.00 cm         LV E/e' lateral: 4.7 LV SV:         47 LV SV Index:   23              2D LVOT Area:     3.14 cm        Longitudinal Strain 2D Strain GLS  -16.9 % Avg:  3D Volume EF LV 3D EF:    Left ventricul ar ejection fraction by 3D volume is 58 %.  3D Volume EF: 3D EF:        58 % LV EDV:       128 ml LV ESV:       53 ml LV SV:        74 ml  RIGHT VENTRICLE RV Basal diam:  4.63 cm RV Mid diam:    3.47 cm RV S prime:     12.80 cm/s TAPSE (M-mode): 1.9 cm  LEFT ATRIUM           Index        RIGHT ATRIUM          Index LA diam:       3.50 cm 1.69 cm/m   RA Area:     9.79 cm LA Vol (A2C): 33.8 ml 16.31 ml/m  RA Volume:   18.60 ml 8.97 ml/m LA Vol (A4C): 19.0 ml 9.17 ml/m AORTIC VALVE LVOT Vmax:   82.30 cm/s LVOT Vmean:  52.900 cm/s LVOT VTI:    0.150 m  AORTA Ao Root diam: 3.00 cm Ao Asc diam:  3.10 cm  MITRAL VALVE               TRICUSPID VALVE MV Area (PHT): 4.31 cm    TR Peak grad:   19.4 mmHg MV Decel Time: 176 msec    TR Vmax:        220.00 cm/s MR Peak grad: 17.6 mmHg MR Vmax:      210.00 cm/s  SHUNTS MV E velocity: 60.30 cm/s  Systemic VTI:  0.15 m MV A velocity: 61.30 cm/s  Systemic Diam: 2.00 cm MV E/A ratio:  0.98  Dorothye Gathers MD Electronically signed by Dorothye Gathers MD Signature Date/Time: 04/13/2023/2:55:20 PM    Final      CT SCANS  CT CARDIAC SCORING (SELF PAY ONLY) 04/26/2023  Addendum 04/30/2023  5:20 PM ADDENDUM REPORT: 04/30/2023 17:18  EXAM: OVER-READ INTERPRETATION  CT CHEST  The following report is an over-read performed by radiologist Dr. Cyndia Drape [facility] on 04/30/2023. This over-read does not include interpretation of cardiac or coronary anatomy or pathology. The coronary calcium score interpretation by the cardiologist is attached.  COMPARISON:  04/22/2017  FINDINGS: Cardiovascular:  Findings discussed in the body of the report.  Mediastinum/Nodes: No suspicious adenopathy identified. Imaged mediastinal structures are unremarkable.  Lungs/Pleura: Dependent basilar  subsegmental atelectasis. Lungs are otherwise clear. No pleural effusion or pneumothorax.  Upper Abdomen: No acute abnormality.  Musculoskeletal: No chest wall abnormality. No acute or significant osseous findings.  IMPRESSION: No significant extracardiac incidental findings identified.   Electronically Signed By: Sydell Eva M.D. On: 04/30/2023 17:18  Narrative CLINICAL DATA:  Risk stratification  EXAM: Coronary Calcium Score  TECHNIQUE: The patient  was scanned on a Siemens Somatom scanner. Axial non-contrast 3 mm slices were carried out through the heart. The data set was analyzed on a dedicated work station and scored using the Agatson method.  FINDINGS: Non-cardiac: See separate report from [facility].  Ascending Aorta: Normal size  Pericardium: Normal  Coronary arteries: Normal origin of left and right coronary arteries. Distribution of arterial calcifications if present, as noted below;  LM 0  LAD 0  LCx 0  RCA 0  Total 0  IMPRESSION AND RECOMMENDATION: 1. Normal coronary calcium score of 0.  2. CAC 0, CAC-DRS A0.  3. Continue heart healthy lifestyle and risk factor modification.  Electronically Signed: By: Constancia Delton M.D. On: 04/27/2023 10:07   CT SCANS  CT CORONARY MORPH W/CTA COR W/SCORE 04/22/2017  Addendum 04/23/2017  5:13 PM ADDENDUM REPORT: 04/23/2017 17:10  CLINICAL DATA:  73 -year-old male with atypical chest pain and family h/o premature CAD.  EXAM: Cardiac/Coronary  CT  TECHNIQUE: The patient was scanned on a Philips 256 scanner.  FINDINGS: A 120 kV prospective scan was triggered in the descending thoracic aorta at 111 HU's. Axial non-contrast 3 mm slices were carried out through the heart. The data set was analyzed on a dedicated work station and scored using the Agatson method. Gantry rotation speed was 270 msecs and collimation was .9 mm. 5 mg of iv Metoprolol  and 0.8 mg of sl NTG was given. The 3D data set was reconstructed in 5% intervals of the 67-82 % of the R-R cycle. Diastolic phases were analyzed on a dedicated work station using MPR, MIP and VRT modes. The patient received 80 cc of contrast.  Aorta:  Normal size.  No calcifications.  No dissection.  Aortic Valve:  Trileaflet.  No calcifications.  Coronary Arteries:  Normal coronary origin.  Right dominance.  RCA is a large dominant artery that gives rise to PDA and PLVB. There is no plaque.  Left  main is a large artery that gives rise to LAD and LCX arteries.  LAD is a large vessel that gives rise to one diagonal branch and has no plaque.  LCX is a non-dominant artery that gives rise to one large OM1 branch. There is no plaque.  Other findings:  Normal pulmonary vein drainage into the left atrium.  Normal let atrial appendage without a thrombus.  IMPRESSION: 1. Coronary calcium score of 0. This was 0 percentile for age and sex matched control.  2. Normal coronary origin with right dominance.  3. No evidence of CAD.  Christoper Crafts   Electronically Signed By: Christoper Crafts On: 04/23/2017 17:10  Narrative EXAM: OVER-READ INTERPRETATION  CT CHEST  The following report is an over-read performed by radiologist Dr. Babs Bolster [facility] on 04/22/2017. This over-read does not include interpretation of cardiac or coronary anatomy or pathology. The coronary calcium score/coronary CTA interpretation by the cardiologist is attached.  COMPARISON:  None.  FINDINGS: Within the visualized portions of the thorax there are no suspicious appearing pulmonary nodules or masses, there is no acute consolidative airspace disease, no pleural effusions, no pneumothorax and  no lymphadenopathy. Visualized portions of the upper abdomen are unremarkable. There are no aggressive appearing lytic or blastic lesions noted in the visualized portions of the skeleton.  IMPRESSION: 1. No significant incidental noncardiac findings are noted.  Electronically Signed: By: Alexandria Angel M.D. On: 04/22/2017 16:07     ______________________________________________________________________________________________      Risk Assessment/Calculations:             Physical Exam:   VS:  BP 120/80 (BP Location: Left Arm, Patient Position: Sitting, Cuff Size: Normal)   Pulse 64   Ht 5\' 7"  (1.702 m)   BMI 33.36 kg/m    Wt Readings from Last 3 Encounters:  04/19/23  213 lb (96.6 kg)  03/30/22 212 lb (96.2 kg)  08/12/20 208 lb 9.6 oz (94.6 kg)    GEN: Well nourished, well developed in no acute distress NECK: No JVD; No carotid bruits CARDIAC: RRR, no murmurs, rubs, gallops RESPIRATORY:  Clear to auscultation without rales, wheezing or rhonchi  ABDOMEN: Soft, non-tender, non-distended EXTREMITIES:  No edema; No deformity   ASSESSMENT AND PLAN: .    Atypical chest pain -no recurrence.  Prior CT 03/2017 and 03/2023 with coronary calcium score of 0.    OSA -endorses wearing CPAP regularly.    HTN - BP well controlled. Continue current antihypertensive regimen losartan  100 mg daily.  Refill provided.  Update CMET.  GERD-well-controlled on omeprazole  20 mg daily.  Continue same.  Refill provided.   HLD - Statin intolerant with rhado. Did not tolerate Zetia  due to diarrhea. Repatha  previously caused cramps. Lp(a) unremarkable. Previously recommended for continue lifestyle management. Update CMET, lipid panel. If elevated ASCVD risk necessitating lipid  lowering therapy, plan for Praluent.         Dispo: follow up in 1 year  Signed, Clearnce Curia, NP

## 2024-04-16 NOTE — Patient Instructions (Signed)
 Medication Instructions:  Your physician recommends that you continue on your current medications as directed. Please refer to the Current Medication list given to you today.  *If you need a refill on your cardiac medications before your next appointment, please call your pharmacy*  Lab Work: Labs today- Lipid Panel & CMP    Follow-Up: At [facility], you and your health needs are our priority.  As part of our continuing mission to provide you with exceptional heart care, our providers are all part of one team.  This team includes your primary Cardiologist (physician) and Advanced Practice Providers or APPs (Physician Assistants and Nurse Practitioners) who all work together to provide you with the care you need, when you need it.  Please follow up in 1 year  with Dr. Theodis Fiscal, Slater Duncan, NP or Neomi Banks, NP   We recommend signing up for the patient portal called "MyChart".  Sign up information is provided on this After Visit Summary.  MyChart is used to connect with patients for Virtual Visits (Telemedicine).  Patients are able to view lab/test results, encounter notes, upcoming appointments, etc.  Non-urgent messages can be sent to your provider as well.   To learn more about what you can do with MyChart, go to [email or web address].

## 2024-04-17 ENCOUNTER — Encounter (HOSPITAL_BASED_OUTPATIENT_CLINIC_OR_DEPARTMENT_OTHER): Payer: Self-pay

## 2024-04-17 LAB — COMPREHENSIVE METABOLIC PANEL WITH GFR
ALT: 18 IU/L (ref 0–44)
AST: 14 IU/L (ref 0–40)
Albumin: 4.5 g/dL (ref 4.1–5.1)
Alkaline Phosphatase: 69 IU/L (ref 44–121)
BUN/Creatinine Ratio: 12 (ref 9–20)
BUN: 15 mg/dL (ref 6–24)
Bilirubin Total: 0.3 mg/dL (ref 0.0–1.2)
CO2: 24 mmol/L (ref 20–29)
Calcium: 9.5 mg/dL (ref 8.7–10.2)
Chloride: 101 mmol/L (ref 96–106)
Creatinine, Ser: 1.25 mg/dL (ref 0.76–1.27)
Globulin, Total: 2.4 g/dL (ref 1.5–4.5)
Glucose: 102 mg/dL — ABNORMAL HIGH (ref 70–99)
Potassium: 4.6 mmol/L (ref 3.5–5.2)
Sodium: 138 mmol/L (ref 134–144)
Total Protein: 6.9 g/dL (ref 6.0–8.5)
eGFR: 75 mL/min/{1.73_m2} (ref >59)

## 2024-04-17 LAB — LIPID PANEL
Chol/HDL Ratio: 6.3 ratio — ABNORMAL HIGH (ref 0.0–5.0)
Cholesterol, Total: 221 mg/dL — ABNORMAL HIGH (ref 100–199)
HDL: 35 mg/dL — ABNORMAL LOW (ref >39)
LDL Chol Calc (NIH): 161 mg/dL — ABNORMAL HIGH (ref 0–99)
Triglycerides: 136 mg/dL (ref 0–149)
VLDL Cholesterol Cal: 25 mg/dL (ref 5–40)

## 2024-04-18 ENCOUNTER — Telehealth (HOSPITAL_BASED_OUTPATIENT_CLINIC_OR_DEPARTMENT_OTHER): Payer: Self-pay

## 2024-04-18 DIAGNOSIS — E782 Mixed hyperlipidemia: Secondary | ICD-10-CM

## 2024-04-18 NOTE — Telephone Encounter (Signed)
-----   Message from Clearnce Curia sent at 04/17/2024  5:07 PM EDT ----- Normal kidneys, liver, electrolytes.  Cholesterol elevated. LDL (bad cholesterol) ideally <100 was 161. Overall The 10-year ASCVD risk score (Arnett DK, et al., 2019) is: 2.4% which is low. Recommend lifestyle changes to lower cholesterol and repeat FLP/LFT in 6 months.   Separate MyChart message sent with detailed cholesterol recommendations

## 2024-10-30 ENCOUNTER — Emergency Department

## 2024-10-30 ENCOUNTER — Other Ambulatory Visit: Payer: Self-pay

## 2024-10-30 ENCOUNTER — Emergency Department
Admission: EM | Admit: 2024-10-30 | Discharge: 2024-10-30 | Disposition: A | Discharge status: Home / Self Care | Attending: Emergency Medicine | Admitting: Emergency Medicine

## 2024-10-30 DIAGNOSIS — R1013 Epigastric pain: Secondary | ICD-10-CM | POA: Diagnosis not present

## 2024-10-30 DIAGNOSIS — D72829 Elevated white blood cell count, unspecified: Secondary | ICD-10-CM | POA: Diagnosis not present

## 2024-10-30 DIAGNOSIS — R112 Nausea with vomiting, unspecified: Secondary | ICD-10-CM | POA: Diagnosis present

## 2024-10-30 DIAGNOSIS — R197 Diarrhea, unspecified: Secondary | ICD-10-CM | POA: Insufficient documentation

## 2024-10-30 DIAGNOSIS — I10 Essential (primary) hypertension: Secondary | ICD-10-CM | POA: Insufficient documentation

## 2024-10-30 DIAGNOSIS — R109 Unspecified abdominal pain: Secondary | ICD-10-CM

## 2024-10-30 LAB — RESP PANEL BY RT-PCR (RSV, FLU A&B, COVID)  RVPGX2
Influenza A by PCR: NEGATIVE
Influenza B by PCR: NEGATIVE
Resp Syncytial Virus by PCR: NEGATIVE
SARS Coronavirus 2 by RT PCR: NEGATIVE

## 2024-10-30 LAB — COMPREHENSIVE METABOLIC PANEL WITH GFR
ALT: 19 U/L (ref 0–44)
AST: 17 U/L (ref 15–41)
Albumin: 4.5 g/dL (ref 3.5–5.0)
Alkaline Phosphatase: 58 U/L (ref 38–126)
Anion gap: 12 (ref 5–15)
BUN: 19 mg/dL (ref 6–20)
CO2: 25 mmol/L (ref 22–32)
Calcium: 9.3 mg/dL (ref 8.9–10.3)
Chloride: 102 mmol/L (ref 98–111)
Creatinine, Ser: 1.06 mg/dL (ref 0.61–1.24)
GFR, Estimated: >60 mL/min (ref >60)
Glucose, Bld: 120 mg/dL — ABNORMAL HIGH (ref 70–99)
Potassium: 3.4 mmol/L — ABNORMAL LOW (ref 3.5–5.1)
Sodium: 139 mmol/L (ref 135–145)
Total Bilirubin: 0.8 mg/dL (ref 0.0–1.2)
Total Protein: 8.6 g/dL — ABNORMAL HIGH (ref 6.5–8.1)

## 2024-10-30 LAB — CBC
HCT: 48.5 % (ref 39.0–52.0)
Hemoglobin: 15.6 g/dL (ref 13.0–17.0)
MCH: 27.1 pg (ref 26.0–34.0)
MCHC: 32.2 g/dL (ref 30.0–36.0)
MCV: 84.2 fL (ref 80.0–100.0)
Platelets: 344 K/uL (ref 150–400)
RBC: 5.76 MIL/uL (ref 4.22–5.81)
RDW: 13.7 % (ref 11.5–15.5)
WBC: 13.3 K/uL — ABNORMAL HIGH (ref 4.0–10.5)
nRBC: 0 % (ref 0.0–0.2)

## 2024-10-30 LAB — LIPASE, BLOOD: Lipase: 30 U/L (ref 11–51)

## 2024-10-30 MED ORDER — PANTOPRAZOLE SODIUM 40 MG PO TBEC: 40.0000 mg | DELAYED_RELEASE_TABLET | Freq: Every day | ORAL | 1 refills | Status: AC

## 2024-10-30 MED ORDER — IOHEXOL 300 MG/ML  SOLN
100.0000 mL | Freq: Once | INTRAMUSCULAR | Status: AC | PRN
  Administered 2024-10-30: 100 mL via INTRAVENOUS

## 2024-10-30 MED ORDER — SODIUM CHLORIDE 0.9 % IV BOLUS
1000.0000 mL | Freq: Once | INTRAVENOUS | Status: AC
  Administered 2024-10-30: 1000 mL via INTRAVENOUS

## 2024-10-30 MED ORDER — ONDANSETRON 4 MG PO TBDP: 4.0000 mg | ORAL_TABLET | Freq: Three times a day (TID) | ORAL | 0 refills | Status: AC | PRN

## 2024-10-30 MED ORDER — ONDANSETRON HCL 4 MG/2ML IJ SOLN
4.0000 mg | Freq: Once | INTRAMUSCULAR | Status: AC
  Administered 2024-10-30: 4 mg via INTRAVENOUS
  Filled 2024-10-30: qty 2

## 2024-10-30 MED ORDER — KETOROLAC TROMETHAMINE 30 MG/ML IJ SOLN
30.0000 mg | Freq: Once | INTRAMUSCULAR | Status: AC
  Administered 2024-10-30: 30 mg via INTRAVENOUS
  Filled 2024-10-30: qty 1

## 2024-10-30 NOTE — Discharge Instructions (Signed)
 Please drink plenty of fluids.  Please use your prescribed nausea medication as needed, as written.  Please take Protonix each day for the next 2 months.  Return to the emergency department for any worsening symptoms, or any other symptom personally concerning to yourself. Should you continue to have symptoms please call the number provided for GI medicine to arrange further evaluation.

## 2024-10-30 NOTE — ED Provider Notes (Addendum)
 Care assumed of patient from outgoing provider.  See their note for initial history, exam and plan.  Clinical Course as of 10/30/24 1552  Tue Oct 30, 2024  1512 Intermittent n/v/d - no respiratory symptoms. + covid/flu recently.  Now again with n/v/d.  WBC 13. CT pending. IVF and symptomatic treatment.  []  CT follow up  [SM]    Clinical Course User Index [SM] Suzanne Kirsch, MD   On reevaluation states he is feeling much better.  CT scan with no acute findings.  Discussed fatty liver with the patient.  Able to tolerate p.o.  Discharged home with symptomatic treatment and return precautions.  Suzanne Kirsch, MD 10/30/24 [phone]    Suzanne Kirsch, MD 10/30/24 [phone]

## 2024-10-30 NOTE — ED Notes (Signed)
 See triage note  Presents with some abd pain associated with some n/v/d   States was positive for flu/covid on friday

## 2024-10-30 NOTE — ED Provider Notes (Signed)
 [facility] Provider Note    Event Date/Time   First MD Initiated Contact with Patient 10/30/24 1342     (approximate)  History   Chief Complaint: Abdominal Pain  HPI  Craig Powell is a 43 y.o. male with a past medical history of hypertension, gastric reflux, hyperlipidemia, presents to the emergency department for nausea vomiting diarrhea and abdominal discomfort.  According to the patient over the last 2 to 3 weeks he has been ill with symptoms of intermittent nausea and vomiting occasional episodes of diarrhea.  States last week he was seen by his PCP for the same had a swab done in the office and was diagnosed with likely COVID and flu (results are not visible in MyChart/Care Everywhere).  Patient states over the weekend he was feeling somewhat better however since yesterday the nausea and vomiting and diarrhea have restarted.  Patient tried to take Imodium this morning but vomited so he came to the emergency department for evaluation.  Patient denies any fever at any point.  States slight epigastric discomfort but relates this to the vomiting.  Physical Exam   Triage Vital Signs: ED Triage Vitals [10/30/24 1256]  Encounter Vitals Group     BP (!) 147/83     Girls Systolic BP Percentile      Girls Diastolic BP Percentile      Boys Systolic BP Percentile      Boys Diastolic BP Percentile      Pulse Rate 96     Resp 18     Temp 98.3 F (36.8 C)     Temp Source Oral     SpO2 98 %     Weight 215 lb (97.5 kg)     Height 5' 7 (1.702 m)     Head Circumference      Peak Flow      Pain Score 5     Pain Loc      Pain Education      Exclude from Growth Chart     Most recent vital signs: Vitals:   10/30/24 1256  BP: (!) 147/83  Pulse: 96  Resp: 18  Temp: 98.3 F (36.8 C)  SpO2: 98%    General: Awake, no distress.  CV:  Good peripheral perfusion.  Regular rate and rhythm  Resp:  Normal effort.  Equal breath sounds bilaterally.  Abd:  No  distention.  Soft, very minimal epigastric tenderness otherwise benign abdomen.  ED Results / Procedures / Treatments   RADIOLOGY  CT pending   MEDICATIONS ORDERED IN ED: Medications  sodium chloride  0.9 % bolus 1,000 mL (has no administration in time range)  ketorolac (TORADOL) 30 MG/ML injection 30 mg (has no administration in time range)  ondansetron  (ZOFRAN ) injection 4 mg (has no administration in time range)     IMPRESSION / MDM / ASSESSMENT AND PLAN / ED COURSE  I reviewed the triage vital signs and the nursing notes.  Patient's presentation is most consistent with acute presentation with potential threat to life or bodily function.  Patient presents to the emergency department for nausea vomiting diarrhea.  Symptoms have been ongoing and intermittent over the last 2.5 weeks, patient states he felt like they were resolving but then restarted again last night.  We will IV hydrate, treat with Toradol and Zofran .  Patient's lab work today does show slight leukocytosis of 13,000 otherwise reassuring CBC, reassuring chemistry normal LFTs and lipase.  However given 2-1/2 weeks of symptoms we will proceed  with a CT scan of the abdomen pelvis to further evaluate.  A respiratory swab has also been sent given the patient's recent possible diagnosis of COVID/flu.  Patient agreeable to plan of care.  Minimal epigastric tenderness otherwise benign abdomen.  Lab work is reassuring.  Urinalysis and CT scan pending.  Patient care signed out to oncoming provider.  FINAL CLINICAL IMPRESSION(S) / ED DIAGNOSES   Nausea vomiting diarrhea   Note:  This document was prepared using Dragon voice recognition software and may include unintentional dictation errors.   Dorothyann Drivers, MD 10/30/24 1451

## 2024-10-30 NOTE — ED Triage Notes (Addendum)
 Pt presents to the ED via POV from home with generalized abdominal pain, nausea, vomiting, and diarrhea since last night. Pt reports being diagnosed with covid and flu on Friday. Reports that he has been sick for about 2.5 weeks, but that the N/V/D is new. Pt A&Ox4. Ambulatory to triage.  Pt states that he was started on an antibiotic and prednisone on Friday. States that it was for any possible underlying infection
